# Patient Record
Sex: Male | Born: 1950 | Race: Black or African American | Hispanic: No | Marital: Married | Smoking: Current every day smoker
Health system: Southern US, Community
[De-identification: ages and names within clinical notes are randomized; demographics above are authoritative.]

## PROBLEM LIST (undated history)

## (undated) DIAGNOSIS — I10 Essential (primary) hypertension: Secondary | ICD-10-CM

## (undated) DIAGNOSIS — E119 Type 2 diabetes mellitus without complications: Secondary | ICD-10-CM

## (undated) DIAGNOSIS — C801 Malignant (primary) neoplasm, unspecified: Secondary | ICD-10-CM

## (undated) DIAGNOSIS — Z8719 Personal history of other diseases of the digestive system: Secondary | ICD-10-CM

## (undated) DIAGNOSIS — I482 Chronic atrial fibrillation, unspecified: Secondary | ICD-10-CM

## (undated) HISTORY — PX: COLONOSCOPY: SHX174

---

## 2010-02-04 HISTORY — PX: PROSTATECTOMY: SHX69

## 2013-06-29 ENCOUNTER — Emergency Department: Payer: Self-pay | Admitting: Emergency Medicine

## 2015-03-25 ENCOUNTER — Emergency Department
Admission: EM | Admit: 2015-03-25 | Discharge: 2015-03-25 | Disposition: A | Discharge status: Home / Self Care | Payer: Medicare HMO | Attending: Emergency Medicine | Admitting: Emergency Medicine

## 2015-03-25 ENCOUNTER — Encounter: Payer: Self-pay | Admitting: Emergency Medicine

## 2015-03-25 DIAGNOSIS — J219 Acute bronchiolitis, unspecified: Secondary | ICD-10-CM | POA: Diagnosis not present

## 2015-03-25 DIAGNOSIS — E119 Type 2 diabetes mellitus without complications: Secondary | ICD-10-CM | POA: Diagnosis not present

## 2015-03-25 DIAGNOSIS — R05 Cough: Secondary | ICD-10-CM | POA: Diagnosis present

## 2015-03-25 DIAGNOSIS — I10 Essential (primary) hypertension: Secondary | ICD-10-CM | POA: Diagnosis not present

## 2015-03-25 DIAGNOSIS — F17200 Nicotine dependence, unspecified, uncomplicated: Secondary | ICD-10-CM | POA: Insufficient documentation

## 2015-03-25 HISTORY — DX: Type 2 diabetes mellitus without complications: E11.9

## 2015-03-25 HISTORY — DX: Essential (primary) hypertension: I10

## 2015-03-25 LAB — RAPID INFLUENZA A&B ANTIGENS: Influenza B (ARMC): NOT DETECTED

## 2015-03-25 LAB — RAPID INFLUENZA A&B ANTIGENS (ARMC ONLY): INFLUENZA A (ARMC): NOT DETECTED

## 2015-03-25 MED ORDER — AZITHROMYCIN 250 MG PO TABS: ORAL_TABLET | ORAL | Status: DC

## 2015-03-25 MED ORDER — BENZONATATE 100 MG PO CAPS: 200.0000 mg | ORAL_CAPSULE | Freq: Three times a day (TID) | ORAL | Status: DC | PRN

## 2015-03-25 NOTE — ED Notes (Signed)
NAD noted at time of D/C. Pt denies questions or concerns. Pt ambulatory to the lobby at this time.  

## 2015-03-25 NOTE — Discharge Instructions (Signed)

## 2015-03-25 NOTE — ED Provider Notes (Signed)
[facility] Emergency Department Provider Note  ____________________________________________  Time seen: Approximately 2:40 PM  I have reviewed the triage vital signs and the nursing notes.   HISTORY  Chief Complaint Cough    HPI Jason Goodman is a 65 y.o. male , NAD, course in emergency department with 1 week history of body aches, cough, fever and fatigue. Notes cough can be productive of green sputum. Has not taken anything over-the-counter at this time for his symptoms and notes he cannot take cough syrup due to his diabetes. No known sick contacts. Denies chest pain, back pain, headache. No numbness, weakness, tingling. Has had no nausea, vomiting, abdominal pain, diarrhea.   Past Medical History  Diagnosis Date  . Hypertension   . Diabetes mellitus without complication ([place])     There are no active problems to display for this patient.   History reviewed. No pertinent past surgical history.  Current Outpatient Rx  Name  Route  Sig  Dispense  Refill  . azithromycin (ZITHROMAX Z-PAK) 250 MG tablet      Take 2 tablets (500 mg) on  Day 1,  followed by 1 tablet (250 mg) once daily on Days 2 through 5.   6 each   0   . benzonatate (TESSALON PERLES) 100 MG capsule   Oral   Take 2 capsules (200 mg total) by mouth 3 (three) times daily as needed for cough.   30 capsule   0     Allergies Review of patient's allergies indicates no known allergies.  History reviewed. No pertinent family history.  Social History Social History  Substance Use Topics  . Smoking status: Current Every Day Smoker  . Smokeless tobacco: None  . Alcohol Use: None     Review of Systems  Constitutional: Positive fever/chills, fatigue.  Eyes: No visual changes. No discharge ENT: No sore throat, sinus pressure, ear pain, nasal congestion. Cardiovascular: No chest pain. Respiratory: Positive productive cough. No shortness of breath. No wheezing.  Gastrointestinal:  No abdominal pain.  No nausea, vomiting.  No diarrhea, constipation. Musculoskeletal: Negative for back pain.  Skin: Negative for rash. Neurological: Negative for headaches, focal weakness or numbness. 10-point ROS otherwise negative.  ____________________________________________   PHYSICAL EXAM:  VITAL SIGNS: ED Triage Vitals  Enc Vitals Group     BP 03/25/15 1225 108/65 mmHg     Pulse Rate 03/25/15 1225 94     Resp 03/25/15 1225 20     Temp 03/25/15 1225 98.7 F (37.1 C)     Temp Source 03/25/15 1225 Oral     SpO2 03/25/15 1225 100 %     Weight 03/25/15 1225 180 lb (81.647 kg)     Height 03/25/15 1225 6' (1.829 m)     Head Cir --      Peak Flow --      Pain Score 03/25/15 1219 8     Pain Loc --      Pain Edu? --      Excl. in [place]? --     Constitutional: Alert and oriented. Well appearing and in no acute distress. Eyes: Conjunctivae are normal. PERRL. EOMI without pain.  Head: Atraumatic. ENT:      Ears: TMs visualized bilaterally without erythema, effusion, bulging, perforation. Bilateral external ear canals without erythema, swelling, discharge.      Nose: No congestion/rhinnorhea.      Mouth/Throat: Mucous membranes are moist. Pharynx without erythema, swelling, exudates. Mild clear postnasal drip. Neck: No stridor. No cervical spine  tenderness to palpation. Supple with full range of motion. Hematological/Lymphatic/Immunilogical: No cervical lymphadenopathy. Cardiovascular: Normal rate, regular rhythm. Normal S1 and S2.  Good peripheral circulation. Respiratory: Normal respiratory effort without tachypnea or retractions. Lungs CTAB. Neurologic:  Normal speech and language. No gross focal neurologic deficits are appreciated.  Skin:  Skin is warm, dry and intact. No rash noted. Psychiatric: Mood and affect are normal. Speech and behavior are normal. Patient exhibits appropriate insight and judgement.   ____________________________________________   LABS (all labs  ordered are listed, but only abnormal results are displayed)  Labs Reviewed  RAPID INFLUENZA A&B ANTIGENS ([facility] ONLY)   ____________________________________________  EKG  None ____________________________________________  RADIOLOGY  None ____________________________________________    PROCEDURES  Procedure(s) performed: None   Medications - No data to display   ____________________________________________   INITIAL IMPRESSION / ASSESSMENT AND PLAN / ED COURSE  Pertinent lab results that were available during my care of the patient were reviewed by me and considered in my medical decision making (see chart for details).  Patient's diagnosis is consistent with acute bronchitis. Patient will be discharged home with prescriptions for Z-Pak and Tessalon Perles. Patient is to follow up with her care provider if symptoms persist past this treatment course. Patient is given ED precautions to return to the ED for any worsening or new symptoms.    ____________________________________________  FINAL CLINICAL IMPRESSION(S) / ED DIAGNOSES  Final diagnoses:  Acute bronchiolitis due to unspecified organism      NEW MEDICATIONS STARTED DURING THIS VISIT:  Discharge Medication List as of 03/25/2015  2:43 PM    START taking these medications   Details  azithromycin (ZITHROMAX Z-PAK) 250 MG tablet Take 2 tablets (500 mg) on  Day 1,  followed by 1 tablet (250 mg) once daily on Days 2 through 5., Print    benzonatate (TESSALON PERLES) 100 MG capsule Take 2 capsules (200 mg total) by mouth 3 (three) times daily as needed for cough., Starting 03/25/2015, Until Discontinued, Print             Braxton Feathers, PA-C 03/25/15 1505  Earleen Newport, MD 03/25/15 [phone]

## 2015-03-25 NOTE — ED Notes (Signed)
NAD noted at this time. Pt c/o cough, fever, generalized body aches x 1 week. Pt states he has been coughing up green sputum. Breath sounds clear bilaterally. Pt ambulatory, skin warm, dry and intact, A&O x4 at time of assessment.

## 2015-03-25 NOTE — ED Notes (Signed)
Reports cough and congestion and aching all over. No resp distress

## 2015-05-27 ENCOUNTER — Inpatient Hospital Stay
Admission: EM | Admit: 2015-05-27 | Discharge: 2015-05-28 | DRG: 920 | Disposition: A | Discharge status: Home / Self Care | Payer: Medicare HMO | Attending: Internal Medicine | Admitting: Internal Medicine

## 2015-05-27 ENCOUNTER — Emergency Department: Payer: Medicare HMO

## 2015-05-27 ENCOUNTER — Encounter: Payer: Self-pay | Admitting: Emergency Medicine

## 2015-05-27 DIAGNOSIS — Z86010 Personal history of colonic polyps: Secondary | ICD-10-CM

## 2015-05-27 DIAGNOSIS — E119 Type 2 diabetes mellitus without complications: Secondary | ICD-10-CM | POA: Diagnosis present

## 2015-05-27 DIAGNOSIS — Z8546 Personal history of malignant neoplasm of prostate: Secondary | ICD-10-CM | POA: Diagnosis not present

## 2015-05-27 DIAGNOSIS — K922 Gastrointestinal hemorrhage, unspecified: Secondary | ICD-10-CM | POA: Diagnosis present

## 2015-05-27 DIAGNOSIS — I10 Essential (primary) hypertension: Secondary | ICD-10-CM | POA: Diagnosis present

## 2015-05-27 DIAGNOSIS — K91840 Postprocedural hemorrhage and hematoma of a digestive system organ or structure following a digestive system procedure: Principal | ICD-10-CM | POA: Diagnosis present

## 2015-05-27 DIAGNOSIS — E785 Hyperlipidemia, unspecified: Secondary | ICD-10-CM | POA: Diagnosis present

## 2015-05-27 DIAGNOSIS — Y838 Other surgical procedures as the cause of abnormal reaction of the patient, or of later complication, without mention of misadventure at the time of the procedure: Secondary | ICD-10-CM | POA: Diagnosis present

## 2015-05-27 DIAGNOSIS — Z79899 Other long term (current) drug therapy: Secondary | ICD-10-CM

## 2015-05-27 DIAGNOSIS — F17200 Nicotine dependence, unspecified, uncomplicated: Secondary | ICD-10-CM | POA: Diagnosis present

## 2015-05-27 DIAGNOSIS — I4891 Unspecified atrial fibrillation: Secondary | ICD-10-CM | POA: Diagnosis present

## 2015-05-27 DIAGNOSIS — Z7984 Long term (current) use of oral hypoglycemic drugs: Secondary | ICD-10-CM

## 2015-05-27 DIAGNOSIS — I959 Hypotension, unspecified: Secondary | ICD-10-CM | POA: Diagnosis present

## 2015-05-27 HISTORY — DX: Malignant (primary) neoplasm, unspecified: C80.1

## 2015-05-27 LAB — PROTIME-INR
INR: 1.14
Prothrombin Time: 14.8 seconds (ref 11.4–15.0)

## 2015-05-27 LAB — COMPREHENSIVE METABOLIC PANEL
ALBUMIN: 3.6 g/dL (ref 3.5–5.0)
ALT: 20 U/L (ref 17–63)
ANION GAP: 6 (ref 5–15)
AST: 21 U/L (ref 15–41)
Alkaline Phosphatase: 67 U/L (ref 38–126)
BILIRUBIN TOTAL: 0.8 mg/dL (ref 0.3–1.2)
BUN: 28 mg/dL — AB (ref 6–20)
CHLORIDE: 112 mmol/L — AB (ref 101–111)
CO2: 23 mmol/L (ref 22–32)
Calcium: 8.6 mg/dL — ABNORMAL LOW (ref 8.9–10.3)
Creatinine, Ser: 1.44 mg/dL — ABNORMAL HIGH (ref 0.61–1.24)
GFR calc Af Amer: 57 mL/min — ABNORMAL LOW (ref >60)
GFR, EST NON AFRICAN AMERICAN: 50 mL/min — AB (ref >60)
GLUCOSE: 122 mg/dL — AB (ref 65–99)
POTASSIUM: 4.2 mmol/L (ref 3.5–5.1)
Sodium: 141 mmol/L (ref 135–145)
TOTAL PROTEIN: 6 g/dL — AB (ref 6.5–8.1)

## 2015-05-27 LAB — CBC
HEMATOCRIT: 37.4 % — AB (ref 40.0–52.0)
HEMATOCRIT: 32.9 % — AB (ref 40.0–52.0)
HEMOGLOBIN: 12.3 g/dL — AB (ref 13.0–18.0)
Hemoglobin: 10.8 g/dL — ABNORMAL LOW (ref 13.0–18.0)
MCH: 31.4 pg (ref 26.0–34.0)
MCH: 30.8 pg (ref 26.0–34.0)
MCHC: 32.9 g/dL (ref 32.0–36.0)
MCHC: 32.8 g/dL (ref 32.0–36.0)
MCV: 95.6 fL (ref 80.0–100.0)
MCV: 94 fL (ref 80.0–100.0)
PLATELETS: 114 10*3/uL — AB (ref 150–440)
Platelets: 147 10*3/uL — ABNORMAL LOW (ref 150–440)
RBC: 3.91 MIL/uL — ABNORMAL LOW (ref 4.40–5.90)
RBC: 3.49 MIL/uL — AB (ref 4.40–5.90)
RDW: 14.7 % — ABNORMAL HIGH (ref 11.5–14.5)
RDW: 14.9 % — ABNORMAL HIGH (ref 11.5–14.5)
WBC: 6.5 10*3/uL (ref 3.8–10.6)
WBC: 6 10*3/uL (ref 3.8–10.6)

## 2015-05-27 LAB — GLUCOSE, CAPILLARY: Glucose-Capillary: 110 mg/dL — ABNORMAL HIGH (ref 65–99)

## 2015-05-27 LAB — TYPE AND SCREEN
ABO/RH(D): A POS
ANTIBODY SCREEN: NEGATIVE

## 2015-05-27 LAB — HEMOGLOBIN: Hemoglobin: 10.4 g/dL — ABNORMAL LOW (ref 13.0–18.0)

## 2015-05-27 LAB — ABO/RH: ABO/RH(D): A POS

## 2015-05-27 MED ORDER — TECHNETIUM TC 99M-LABELED RED BLOOD CELLS IV KIT
23.3500 | PACK | Freq: Once | INTRAVENOUS | Status: AC | PRN
  Administered 2015-05-27: 23.35 via INTRAVENOUS

## 2015-05-27 MED ORDER — SODIUM CHLORIDE 0.9 % IV BOLUS (SEPSIS)
1000.0000 mL | Freq: Once | INTRAVENOUS | Status: AC
Start: 2015-05-27 — End: 2015-05-27
  Administered 2015-05-27: 1000 mL via INTRAVENOUS

## 2015-05-27 MED ORDER — INSULIN ASPART 100 UNIT/ML ~~LOC~~ SOLN: 0.0000 [IU] | Freq: Three times a day (TID) | SUBCUTANEOUS | Status: DC

## 2015-05-27 MED ORDER — ONDANSETRON HCL 4 MG PO TABS: 4.0000 mg | ORAL_TABLET | Freq: Four times a day (QID) | ORAL | Status: DC | PRN

## 2015-05-27 MED ORDER — ACETAMINOPHEN 325 MG PO TABS: 650.0000 mg | ORAL_TABLET | Freq: Four times a day (QID) | ORAL | Status: DC | PRN

## 2015-05-27 MED ORDER — SODIUM CHLORIDE 0.9% FLUSH: 3.0000 mL | Freq: Two times a day (BID) | INTRAVENOUS | Status: DC

## 2015-05-27 MED ORDER — ACETAMINOPHEN 650 MG RE SUPP
650.0000 mg | Freq: Four times a day (QID) | RECTAL | Status: DC | PRN
Start: 2015-05-27 — End: 2015-05-28

## 2015-05-27 MED ORDER — ATORVASTATIN CALCIUM 20 MG PO TABS
40.0000 mg | ORAL_TABLET | ORAL | Status: DC
  Administered 2015-05-28: 40 mg via ORAL
  Filled 2015-05-27: qty 2

## 2015-05-27 MED ORDER — DIATRIZOATE MEGLUMINE & SODIUM 66-10 % PO SOLN
15.0000 mL | Freq: Once | ORAL | Status: AC
  Administered 2015-05-27: 15 mL via ORAL

## 2015-05-27 MED ORDER — CARVEDILOL 12.5 MG PO TABS
25.0000 mg | ORAL_TABLET | Freq: Two times a day (BID) | ORAL | Status: DC
  Administered 2015-05-28 (×2): 25 mg via ORAL
  Filled 2015-05-27 (×2): qty 2

## 2015-05-27 MED ORDER — IOPAMIDOL (ISOVUE-300) INJECTION 61%
80.0000 mL | Freq: Once | INTRAVENOUS | Status: AC | PRN
  Administered 2015-05-27: 80 mL via INTRAVENOUS

## 2015-05-27 MED ORDER — ONDANSETRON HCL 4 MG/2ML IJ SOLN: 4.0000 mg | Freq: Four times a day (QID) | INTRAMUSCULAR | Status: DC | PRN

## 2015-05-27 MED ORDER — SODIUM CHLORIDE 0.9 % IV SOLN
INTRAVENOUS | Status: DC
  Administered 2015-05-27: 20:00:00 via INTRAVENOUS

## 2015-05-27 NOTE — ED Notes (Signed)
Report given to Terrence Dupont, RN. Pt still in [facility] at this time.

## 2015-05-27 NOTE — ED Notes (Signed)
Pt up to the restroom with no difficulty. Placed back on monitor at this time.

## 2015-05-27 NOTE — ED Notes (Signed)
Pt returned from CT °

## 2015-05-27 NOTE — ED Provider Notes (Addendum)
[facility] Emergency Department Provider Note  Time seen: 11:33 AM  I have reviewed the triage vital signs and the nursing notes.   HISTORY  Chief Complaint GI Bleeding    HPI Jason Goodman is a 65 y.o. male with a past medical history of hypertension and diabetes who presents to the emergency department or GI bleeding. According to the patient he had been having intermittent bloody stools. He was seen by GI medicine 2 days ago for a colonoscopy. Patient had 3 polyps removed. He states beginning yesterday morning he was experiencing occasional blood in his stool which they said would be normal however today he is having very frequent bright red blood stools. Patient had a bowel movement in the emergency department which was a large amount of bright red blood. Patient states moderate right lower quadrant pain dull/aching. Denies nausea or vomiting.     Past Medical History  Diagnosis Date  . Hypertension   . Diabetes mellitus without complication ([place])     There are no active problems to display for this patient.   Past Surgical History  Procedure Laterality Date  . Colonoscopy      Current Outpatient Rx  Name  Route  Sig  Dispense  Refill  . azithromycin (ZITHROMAX Z-PAK) 250 MG tablet      Take 2 tablets (500 mg) on  Day 1,  followed by 1 tablet (250 mg) once daily on Days 2 through 5.   6 each   0   . benzonatate (TESSALON PERLES) 100 MG capsule   Oral   Take 2 capsules (200 mg total) by mouth 3 (three) times daily as needed for cough.   30 capsule   0     Allergies Review of patient's allergies indicates no known allergies.  No family history on file.  Social History Social History  Substance Use Topics  . Smoking status: Current Every Day Smoker  . Smokeless tobacco: None  . Alcohol Use: No    Review of Systems Constitutional: Negative for fever. Cardiovascular: Negative for chest pain. Respiratory: Negative for shortness  of breath. Gastrointestinal: Positive for right lower quadrant abdominal pain. Negative for nausea or vomiting. Positive for bright red blood loose stool. Genitourinary: Negative for dysuria. Musculoskeletal: Negative for back pain. Neurological: Negative for headache 10-point ROS otherwise negative.  ____________________________________________   PHYSICAL EXAM:  VITAL SIGNS: ED Triage Vitals  Enc Vitals Group     BP 05/27/15 1102 94/78 mmHg     Pulse Rate 05/27/15 1102 77     Resp 05/27/15 1102 18     Temp 05/27/15 1102 98 F (36.7 C)     Temp Source 05/27/15 1102 Oral     SpO2 05/27/15 1102 100 %     Weight 05/27/15 1102 180 lb (81.647 kg)     Height 05/27/15 1102 6' (1.829 m)     Head Cir --      Peak Flow --      Pain Score 05/27/15 1103 5     Pain Loc --      Pain Edu? --      Excl. in [place]? --     Constitutional: Alert and oriented. Well appearing and in no distress. Eyes: Normal exam ENT   Head: Normocephalic and atraumatic.   Mouth/Throat: Mucous membranes are moist. Cardiovascular: Normal rate, regular rhythm. No murmur Respiratory: Normal respiratory effort without tachypnea nor retractions. Breath sounds are clear Gastrointestinal: Soft and nontender. No distention.  Musculoskeletal: Nontender  with normal range of motion in all extremities.  Neurologic:  Normal speech and language. No gross focal neurologic deficits Skin:  Skin is warm, dry and intact.  Psychiatric: Mood and affect are normal. Speech and behavior are normal.   ____________________________________________    EKG  EKG reviewed and interpreted by myself shows atrial fibrillation at 67 bpm, narrow QRS, normal axis, no concerning ST changes noted.  ____________________________________________    RADIOLOGY  CT scan of the abdomen and pelvis does not show any source of bleeding. No rupture/perforation. Patient does have a cylindrical 2 cm foreign body in the sigmoid colon consistent  with polyp clip.  ____________________________________________   INITIAL IMPRESSION / ASSESSMENT AND PLAN / ED COURSE  Pertinent labs & imaging results that were available during my care of the patient were reviewed by me and considered in my medical decision making (see chart for details).  Patient presents the department GI bleeding 2 days status post colonoscopy. While in the emergency department the patient had a large bright red blood stool, appeared to contain mostly blood. Currently patient is somewhat hypotensive 94/78 with a normal pulse rate however his EKG appears to show atrial fibrillation. Patient states he has a history of "in irregular heartbeat" however he states this is many years ago and as far as he knows he does not stay in atrial fibrillation. Patient does not take any blood thinners.  Patient's hemoglobin has resulted at 12.3. However given the amount of bloody stool patient will require admission to the hospital to trend his hemoglobin. Also the patient is in atrial fibrillation which is likely new.   I discussed the patient with Dr. Drema Dallas of GI medicine. Patient will be admitted to the hospital for further treatment. I've ordered a tagged red blood cell scan in an attempt to visualize the source of bleed.  Patient has been seen by Dr. Drema Dallas in the emergency department. He states that the patient continues to bleed he plans to perform a colonoscopy in the morning. I discussed with the hospitalist for admission, they state they would like the bleeding scan performed first before they admit.  ----------------------------------------- 7:02 PM on 05/27/2015 -----------------------------------------  Currently awaiting bleeding scan results. Patient is currently at nuclear medicine. We will continue to monitor the patient in the emergency department until the bleeding scan results are known and hopefully the patient can then be admitted to the hospital.  Bleeding scan is  negative. We will admit the patient to the hospital.  ____________________________________________   FINAL CLINICAL IMPRESSION(S) / ED DIAGNOSES  Atrial fibrillation Lower GI bleed   Harvest Dark, MD 05/27/15 1407  Harvest Dark, MD 05/27/15 [place], MD 05/27/15 [id number]

## 2015-05-27 NOTE — ED Notes (Signed)
Pt taken to Nuclear Medicine.

## 2015-05-27 NOTE — Consult Note (Signed)
GI Consultation Note  Referring Provider: Harvest Dark, MD Date of Consult: 05/27/2015  HPI: Jason Goodman is a 65 y.o. male being seen in consultation at the request of Harvest Dark, MD for rectal bleeding.  Jason Goodman is a 65 yo man with PMH significant for recent colonoscopy with polypectomy 2 days ago. His wife reports that Dr. Rayann Heman removed 3 polyps ranging from 34m to 20 mm, with a clip placed at one site. The pt tolerated the initial procedure well, however on the evening of the procedure he noted some blood on the toilet paper with wiping. This increased in volume yesterday morning, and he proceeded to have at least 5 BM that he describes as large volume blood "spurts" mixed with blood clots. He denies any abdominal pain associated with most of these, though at times he experienced some cramping. He notes that he took some Advil last week prior to the colonoscopy, but none after his procedure. He denies use of other blood thinners. He felt lightheaded with nausea this morning, which prompted his presentation. He has had 3 BM of large amounts of blood and clots earlier today in the ED, but none in the past few hours per his report.   PMH: Past Medical History  Diagnosis Date  . Hypertension   . Diabetes mellitus without complication ([place]   . Cancer ([facility]     PSH: Past Surgical History  Procedure Laterality Date  . Colonoscopy      Family History: No family history on file.  Social History: Social History   Social History  . Marital Status: Married    Spouse Name: N/A  . Number of Children: N/A  . Years of Education: N/A   Occupational History  . Not on file.   Social History Main Topics  . Smoking status: Current Every Day Smoker  . Smokeless tobacco: Not on file  . Alcohol Use: No  . Drug Use: Not on file  . Sexual Activity: Not on file   Other Topics Concern  . Not on file   Social History Narrative    ROS: The balance of a 12 system review  is negative other than as described in the HPI.  SCHEDULED MEDS:   PHYSICAL EXAM: Filed Vitals:   05/27/15 1400 05/27/15 1528  BP: 123/87 117/85  Pulse: 72 66  Temp:    Resp: 22 18    GEN: Alert, oriented x3 in no apparent distress. HEENT: Oropharynx clear. Anicteric CV: Nl rate, nl rhythm. No murmurs, rubs or gallops. LUNGS: Clear to auscultation bilaterally. No wheezes, rales or rhonchi. ABD: Bowel sounds present. Abdomen soft, mildly tender to palpation in the RLQ, nondistended. EXT: no edema NEURO: no focal neurologic deficits.  LABS: CBC Latest Ref Rng 05/27/2015  WBC 3.8 - 10.6 K/uL 6.5  Hemoglobin 13.0 - 18.0 g/dL 12.3(L)  Hematocrit 40.0 - 52.0 % 37.4(L)  Platelets 150 - 440 K/uL 147(L)    BMP Latest Ref Rng 05/27/2015  Glucose 65 - 99 mg/dL 122(H)  BUN 6 - 20 mg/dL 28(H)  Creatinine 0.61 - 1.24 mg/dL 1.44(H)  Sodium 135 - 145 mmol/L 141  Potassium 3.5 - 5.1 mmol/L 4.2  Chloride 101 - 111 mmol/L 112(H)  CO2 22 - 32 mmol/L 23  Calcium 8.9 - 10.3 mg/dL 8.6(L)    Hepatic Function Latest Ref Rng 05/27/2015  Total Protein 6.5 - 8.1 g/dL 6.0(L)  Albumin 3.5 - 5.0 g/dL 3.6  AST 15 - 41 U/L 21  ALT 17 - 63  U/L 20  Alk Phosphatase 38 - 126 U/L 67  Total Bilirubin 0.3 - 1.2 mg/dL 0.8    ASSESSMENT: Jason Goodman is a 65 y.o. male with recent colonoscopy and polypectomy now here with bleeding. He describes blood and clots passing per rectum in the past 24 hours, which are concerning for a post-polypectomy bleed. He denies any use of recent NSAIDs or other blood thinners. The bleeding may have slowed in frequency, and understandably, he is not excited about the prospects of another colonoscopy prep. For now I have recommended monitoring for further bleeding episodes. If he continues to have bleeding in the next 4 hours, we should plan to prep with Miralax 238g in 64 oz Gatorade for colonoscopy tomorrow morning. If he has no further bleeding and his Hgb/Hct are stable, he  may not need repeat colonoscopy and intervention. He expressed understanding and is agreeable to this plan. I have discussed this plan with the Dr. Kerman Passey in the ED who is in agreement. I am not sure that a tagged RBC scan will add much in this case, given my high suspicion that this bleeding episode is due to a post-polypectomy bleed (given temporal relationship to recent colonoscopic evaluation, which presumably would have revealed other bleeding sources).   RECOMMENDATIONS: - clear liquid diet now - monitor for further bleeding - if continued bleeding in next few hours, plan for colonoscopy tomorrow - if colonoscopy tomorrow, prep with Miralax 238 g in 64 oz Gatorade (pt does not want GoLytely) - continue to monitor Hgb/Hct  Lameeka Schleifer L. Drema Dallas, MD, MPH

## 2015-05-27 NOTE — ED Notes (Signed)
Patient presents to the ED with black stools, bright red rectal bleeding, and passing blood clots through patient's rectum that began around 10pm yesterday.  Patient reports having a colonoscopy performed on Thursday and reports that several polyps were removed at that time.  Patient has history of "irregular heartbeat" and is in Afib at this time.  Patient denies taking blood thinners to the best of his knowledge.  Patient is alert and oriented x 4.  Reports weakness.  Patient's skin appears slightly pale.

## 2015-05-27 NOTE — H&P (Signed)
[facility] [place] NAME: Jason Goodman    MR#:  [id number]  DATE OF BIRTH:  1950-02-19  DATE OF ADMISSION:  05/27/2015  PRIMARY CARE PHYSICIAN: Ancil Boozer MD.   REQUESTING/REFERRING PHYSICIAN: Harvest Dark MD  CHIEF COMPLAINT:   Chief Complaint  Patient presents with  . GI Bleeding    HISTORY OF PRESENT ILLNESS: Jason Goodman  is a 65 y.o. male with a known history of  DMII, HTN, with prostate cancer Who underwent a colonoscopy with polypectomy 2 days ago with Dr. Rayann Heman. Patient had 3 polyps removed. One polyp was 17 mm to was 20 mm. One of the polyps needed a clip placement. Patient reports that he noticed some bright red blood on the evening of the procedure. However since yesterday he had increase in the amount of bleeding. He describes the bleeding as bright red blood mixed with clots and dark blood. He reports that it was "squirting". Patient had multiple episodes of this. He came to the ED. Due to persistent bleeding he underwent a bleeding scan. Which was negative. He was also seen in consultation by GI who recommended admission. Patient reports that the last bowel movement he had noticed that the bleeding had stopped. He also had some abdominal cramping when he was having these bleeding episodes. PAST MEDICAL HISTORY:   Past Medical History  Diagnosis Date  . Hypertension   . Diabetes mellitus without complication ([place])   . prostate cancer     PAST SURGICAL HISTORY: Past Surgical History  Procedure Laterality Date  . Colonoscopy      SOCIAL HISTORY:  Social History  Substance Use Topics  . Smoking status: Current Every Day Smoker  . Smokeless tobacco: Not on file  . Alcohol Use: No    FAMILY HISTORY:  Family History  Problem Relation Age of Onset  . Hypertension      DRUG ALLERGIES: No Known Allergies  REVIEW OF SYSTEMS:   CONSTITUTIONAL: No fever, fatigue or weakness.  EYES: No blurred or double vision.   EARS, NOSE, AND THROAT: No tinnitus or ear pain.  RESPIRATORY: No cough, shortness of breath, wheezing or hemoptysis.  CARDIOVASCULAR: No chest pain, orthopnea, edema.  GASTROINTESTINAL: No nausea, vomiting, diarrhea orPositive abdominal cramping, positive bright red blood per rectum  GENITOURINARY: No dysuria, hematuria.  ENDOCRINE: No polyuria, nocturia,  HEMATOLOGY: No anemia, easy bruising or bleeding SKIN: No rash or lesion. MUSCULOSKELETAL: No joint pain or arthritis.   NEUROLOGIC: No tingling, numbness, weakness.  PSYCHIATRY: No anxiety or depression.   MEDICATIONS AT HOME:  Prior to Admission medications   Medication Sig Start Date End Date Taking? Authorizing Provider  amLODipine (NORVASC) 5 MG tablet Take 5 mg by mouth daily. 08/04/13  Yes Historical Provider, MD  atorvastatin (LIPITOR) 40 MG tablet Take 40 mg by mouth every morning. 08/04/13  Yes Historical Provider, MD  carvedilol (COREG) 25 MG tablet Take 25 mg by mouth 2 (two) times daily. 08/04/13  Yes Historical Provider, MD  metFORMIN (GLUCOPHAGE) 500 MG tablet Take 500 mg by mouth daily. 03/17/14  Yes Historical Provider, MD  azithromycin (ZITHROMAX Z-PAK) 250 MG tablet Take 2 tablets (500 mg) on  Day 1,  followed by 1 tablet (250 mg) once daily on Days 2 through 5. 03/25/15   Jami L Hagler, PA-C  benzonatate (TESSALON PERLES) 100 MG capsule Take 2 capsules (200 mg total) by mouth 3 (three) times daily as needed for cough. 03/25/15   Jami L  Hagler, PA-C      PHYSICAL EXAMINATION:   VITAL SIGNS: Blood pressure 113/85, pulse 59, temperature 98 F (36.7 C), temperature source Oral, resp. rate 17, height 6' (1.829 m), weight 81.647 kg (180 lb), SpO2 100 %.  GENERAL:  65 y.o.-year-old patient lying in the bed with no acute distress.  EYES: Pupils equal, round, reactive to light and accommodation. No scleral icterus. Extraocular muscles intact.  HEENT: Head atraumatic, normocephalic. Oropharynx and nasopharynx clear.  NECK:   Supple, no jugular venous distention. No thyroid enlargement, no tenderness.  LUNGS: Normal breath sounds bilaterally, no wheezing, rales,rhonchi or crepitation. No use of accessory muscles of respiration.  CARDIOVASCULAR: S1, S2 normal. No murmurs, rubs, or gallops.  ABDOMEN: Soft, nontender, nondistended. Bowel sounds present. No organomegaly or mass.  EXTREMITIES: No pedal edema, cyanosis, or clubbing.  NEUROLOGIC: Cranial nerves II through XII are intact. Muscle strength 5/5 in all extremities. Sensation intact. Gait not checked.  PSYCHIATRIC: The patient is alert and oriented x 3.  SKIN: No obvious rash, lesion, or ulcer.   LABORATORY PANEL:   CBC  Recent Labs Lab 05/27/15 1104 05/27/15 1558  WBC 6.5 6.0  HGB 12.3* 10.8*  HCT 37.4* 32.9*  PLT 147* 114*  MCV 95.6 94.0  MCH 31.4 30.8  MCHC 32.9 32.8  RDW 14.7* 14.9*   ------------------------------------------------------------------------------------------------------------------  Chemistries   Recent Labs Lab 05/27/15 1104  NA 141  K 4.2  CL 112*  CO2 23  GLUCOSE 122*  BUN 28*  CREATININE 1.44*  CALCIUM 8.6*  AST 21  ALT 20  ALKPHOS 67  BILITOT 0.8   ------------------------------------------------------------------------------------------------------------------ estimated creatinine clearance is 56.1 mL/min (by C-G formula based on Cr of 1.44). ------------------------------------------------------------------------------------------------------------------ No results for input(s): TSH, T4TOTAL, T3FREE, THYROIDAB in the last 72 hours.  Invalid input(s): FREET3   Coagulation profile  Recent Labs Lab 05/27/15 1104  INR 1.14   ------------------------------------------------------------------------------------------------------------------- No results for input(s): DDIMER in the last 72  hours. -------------------------------------------------------------------------------------------------------------------  Cardiac Enzymes No results for input(s): CKMB, TROPONINI, MYOGLOBIN in the last 168 hours.  Invalid input(s): CK ------------------------------------------------------------------------------------------------------------------ Invalid input(s): POCBNP  ---------------------------------------------------------------------------------------------------------------  Urinalysis No results found for: COLORURINE, APPEARANCEUR, LABSPEC, PHURINE, GLUCOSEU, HGBUR, BILIRUBINUR, KETONESUR, PROTEINUR, UROBILINOGEN, NITRITE, LEUKOCYTESUR   RADIOLOGY: Nm Gi Blood Loss  05/27/2015  CLINICAL DATA:  A suggest some bloating 2 days status post colonoscopy. Activity bleeding ovary time mucosa bathroom. EXAM: NUCLEAR MEDICINE GASTROINTESTINAL BLEEDING SCAN TECHNIQUE: Sequential abdominal images were obtained following intravenous administration of Tc-18m labeled red blood cells. RADIOPHARMACEUTICALS:  23.4 mCi Tc-80m in-vitro labeled red cells. COMPARISON:  CT 05/27/2015 FINDINGS: There is no accumulation of tagged red blood cells within the abdomen or pelvis to localize active gastrointestinal bleeding. There is activity inferior to the bladder which is felt to represent urine contamination. This is relatively static throughout the exam. Physiologic activity is noted in the blood pool and bladder. IMPRESSION: No scintigraphic evidence of active gastrointestinal bleeding. Electronically Signed   By: Suzy Bouchard M.D.   On: 05/27/2015 19:33   Ct Abdomen Pelvis W Contrast  05/27/2015  CLINICAL DATA:  Patient presents to the ED with black stools, bright red rectal bleeding, and passing blood clots through patient's rectum that began around 10pm yesterday. Patient reports having a colonoscopy performed on Thursday and reports that several polyps were removed at that time. Patient has history  of "irregular heartbeat" and is in Afib at this time. Patient denies taking blood thinners to the best of his knowledge. Patient is alert and  oriented x 4. Reports weakness. Patient's skin appears slightly pale. HX prostate ca removed. EXAM: CT ABDOMEN AND PELVIS WITH CONTRAST TECHNIQUE: Multidetector CT imaging of the abdomen and pelvis was performed using the standard protocol following bolus administration of intravenous contrast. CONTRAST:  64mL ISOVUE-300 IOPAMIDOL (ISOVUE-300) INJECTION 61% COMPARISON:  None. FINDINGS: Lung bases:  Clear.  Heart normal size. Liver, spleen, gallbladder, pancreas:  Unremarkable. Adrenal glands: Large left adrenal cystic mass with Hounsfield units averaging 11.5. There is peripheral calcification along this mass. It measures 6.5 x 5.6 cm in greatest transverse dimension. This is likely the sequela of a remote adrenal hemorrhage. Normal right adrenal gland. Kidneys, ureters, bladder: Bilateral renal cortical thinning. There are several low-density renal masses, most too small to fully characterize. The largest arises from the midpole of the left kidney measuring 5.3 cm, consistent with a cyst. No ureteral or renal stones. No hydronephrosis. Ureters normal course and caliber. Directly posterior to the bladder and there is a small fluid collection measuring 2.4 x 1.9 x 2.0 cm. Although this is not clearly arising from the bladder, a small posterior bladder diverticulum is suspected. It could alternatively reflect a seminal vesicle cyst. Bladder otherwise unremarkable. Reproductive: Patient has a penile prosthesis with the reservoir in the left antral lateral pelvis. Lymph nodes:  No adenopathy. Vascular: Infrarenal abdominal aortic aneurysm measuring maximum of 4.3 x 3.8 cm transversely. Ascites:  None. Gastrointestinal: There is a cylindrical metal foreign body that lies within the sigmoid colon. Measures 2 cm in length. This is of unclear etiology. There is no evidence of bowel  obstruction. There is no colonic wall thickening or inflammatory change. There is no extraluminal air to suggest a colonic perforation following colonoscopy. Small hiatal hernia. Stomach otherwise unremarkable. Normal small bowel. Appendix not visualized. Musculoskeletal: Mild disc degenerative changes along the lumbar spine. No osteoblastic or osteolytic lesions. IMPRESSION: 1. Source of the patient's GI bleeding is not conclusive on this exam. 2. There is no evidence of a bowel perforation. No colonic mass is seen. 3. There is a 2 cm cylindrical metallic foreign body in the sigmoid colon of unclear etiology. 4. Multiple chronic findings, including: 6.5 cm low-density, peripherally calcified left adrenal mass likely a sequelae of a remote left adrenal hemorrhage, renal cortical thinning. 5.3 cm left renal cyst. It 5. Infrarenal abdominal aortic aneurysm measuring a maximum of 4.3 x 3.8 cm. Recommend followup by ultrasound in 1 year. This recommendation follows ACR consensus guidelines: White Paper of the ACR Incidental Findings Committee II on Vascular Findings. J Am Coll Radiol 2013; 10:789-794. Electronically Signed   By: Lajean Manes M.D.   On: 05/27/2015 13:11    EKG: Orders placed or performed during the hospital encounter of 05/27/15  . ED EKG  . ED EKG    IMPRESSION AND PLAN: Patient is a 65 year old African-American male presenting with lower GI bleed  1. Lower GI bleed suspect due to recent polypectomy. At this point will monitor his hemoglobin and transfuse if hemodynamically unstable or hemoglobin drops below 7. He has been seen by GI who recommend clear liquid diet. If bleeding persists will need a colonoscopy according to them.  2. Diabetes type 2 that this point I'll hold his metformin just place him on sliding scale insulin  3. Hypertension borderline at this point I will continue his Coreg but hold his Norvasc  4. hyperlipedemia: continue atrovastatin   All the records are  reviewed and case discussed with ED provider. Management plans discussed with the  patient, family and they are in agreement.  CODE STATUS:    Code Status Orders        Start     Ordered   05/27/15 2005  Full code   Continuous     05/27/15 2004    Code Status History    Date Active Date Inactive Code Status Order ID Comments User Context   This patient has a current code status but no historical code status.       TOTAL TIME TAKING CARE OF THIS PATIENT:55 minutes.    Dustin Flock M.D on 05/27/2015 at 8:07 PM  Between 7am to 6pm - Pager - [phone]  After 6pm go to [email or web address] - password [email or web address] [facility]  [place] [facility]  Office  [phone]  CC: Primary care physician; No primary care provider on file.

## 2015-05-27 NOTE — ED Notes (Signed)
Pt taken to CT.

## 2015-05-28 LAB — BASIC METABOLIC PANEL
Anion gap: 5 (ref 5–15)
BUN: 21 mg/dL — AB (ref 6–20)
CALCIUM: 8.3 mg/dL — AB (ref 8.9–10.3)
CO2: 23 mmol/L (ref 22–32)
CREATININE: 1.16 mg/dL (ref 0.61–1.24)
Chloride: 113 mmol/L — ABNORMAL HIGH (ref 101–111)
GFR calc Af Amer: >60 mL/min (ref >60)
GFR calc non Af Amer: >60 mL/min (ref >60)
GLUCOSE: 75 mg/dL (ref 65–99)
Potassium: 4 mmol/L (ref 3.5–5.1)
Sodium: 141 mmol/L (ref 135–145)

## 2015-05-28 LAB — GLUCOSE, CAPILLARY
Glucose-Capillary: 82 mg/dL (ref 65–99)
Glucose-Capillary: 210 mg/dL — ABNORMAL HIGH (ref 65–99)

## 2015-05-28 LAB — CBC
HEMATOCRIT: 29.7 % — AB (ref 40.0–52.0)
Hemoglobin: 10 g/dL — ABNORMAL LOW (ref 13.0–18.0)
MCH: 32.1 pg (ref 26.0–34.0)
MCHC: 33.6 g/dL (ref 32.0–36.0)
MCV: 95.5 fL (ref 80.0–100.0)
Platelets: 110 10*3/uL — ABNORMAL LOW (ref 150–440)
RBC: 3.11 MIL/uL — ABNORMAL LOW (ref 4.40–5.90)
RDW: 14.5 % (ref 11.5–14.5)
WBC: 5.5 10*3/uL (ref 3.8–10.6)

## 2015-05-28 LAB — HEMOGLOBIN: Hemoglobin: 9.6 g/dL — ABNORMAL LOW (ref 13.0–18.0)

## 2015-05-28 NOTE — Discharge Summary (Signed)
[facility] [place] NAME: Bow Ader    MR#:  [id number]  DATE OF BIRTH:  29-Jun-1950  DATE OF ADMISSION:  05/27/2015 ADMITTING PHYSICIAN: Dustin Flock, MD  DATE OF DISCHARGE: 05/28/15  PRIMARY CARE PHYSICIAN: No primary care provider on file.    ADMISSION DIAGNOSIS:  GI bleed [K92.2] Gastrointestinal hemorrhage, unspecified gastritis, unspecified gastrointestinal hemorrhage type [K92.2] Atrial fibrillation, unspecified type ([place]) [I48.91]  DISCHARGE DIAGNOSIS:  Active Problems:   GI bleed   SECONDARY DIAGNOSIS:   Past Medical History  Diagnosis Date  . Hypertension   . Diabetes mellitus without complication ([place])   . prostate cancer     HOSPITAL COURSE:   65y/oM with PMH of HTN, NIDDM who had colonoscopy and polypectomy done 3 days ago admitted for rectal bleed  #1 Post polypectomy rectal bleed- resolved now - still passing dark clots- much improved - Bleeding scan is negative, tolerating diet - No drop in Hb since admission - advised monitoring Hb more- but patient very eager to go home as his bleeding subsided - advised to come back to ER for any further bleeding - Appreciate GI consult - will be discharged today  #2 HTN- cont outpatient meds- norvasc and coreg  #3 NIDDM- cont metformin  #4 Hyperlipidemia- on statin  Discharge home today   DISCHARGE CONDITIONS:   Stable  CONSULTS OBTAINED:  Treatment Team:  Fredonia Highland, MD  DRUG ALLERGIES:  No Known Allergies  DISCHARGE MEDICATIONS:   Current Discharge Medication List    CONTINUE these medications which have NOT CHANGED   Details  amLODipine (NORVASC) 5 MG tablet Take 5 mg by mouth daily.    atorvastatin (LIPITOR) 40 MG tablet Take 40 mg by mouth every morning.    carvedilol (COREG) 25 MG tablet Take 25 mg by mouth 2 (two) times daily.    metFORMIN (GLUCOPHAGE) 500 MG tablet Take 500 mg by mouth daily.      STOP taking these  medications     azithromycin (ZITHROMAX Z-PAK) 250 MG tablet      benzonatate (TESSALON PERLES) 100 MG capsule          DISCHARGE INSTRUCTIONS:   1. Monitor for any rectal bleeding 2. PCP f/u in 1-2 weeks 3. GI f/u in 1-2 weeks  If you experience worsening of your admission symptoms, develop shortness of breath, life threatening emergency, suicidal or homicidal thoughts you must seek medical attention immediately by calling 911 or calling your MD immediately  if symptoms less severe.  You Must read complete instructions/literature along with all the possible adverse reactions/side effects for all the Medicines you take and that have been prescribed to you. Take any new Medicines after you have completely understood and accept all the possible adverse reactions/side effects.   Please note  You were cared for by a hospitalist during your hospital stay. If you have any questions about your discharge medications or the care you received while you were in the hospital after you are discharged, you can call the unit and asked to speak with the hospitalist on call if the hospitalist that took care of you is not available. Once you are discharged, your primary care physician will handle any further medical issues. Please note that NO REFILLS for any discharge medications will be authorized once you are discharged, as it is imperative that you return to your primary care physician (or establish a relationship with a primary care physician if you do not  have one) for your aftercare needs so that they can reassess your need for medications and monitor your lab values.    Today   CHIEF COMPLAINT:   Chief Complaint  Patient presents with  . GI Bleeding    VITAL SIGNS:  Blood pressure 112/73, pulse 67, temperature 98 F (36.7 C), temperature source Oral, resp. rate 20, height 6' (1.829 m), weight 80.06 kg (176 lb 8 oz), SpO2 99 %.  I/O:   Intake/Output Summary (Last 24 hours) at 05/28/15  1146 Last data filed at 05/28/15 0953  Gross per 24 hour  Intake    386 ml  Output      0 ml  Net    386 ml    PHYSICAL EXAMINATION:   Physical Exam  GENERAL:  65 y.o.-year-old patient lying in the bed with no acute distress.  EYES: Pupils equal, round, reactive to light and accommodation. No scleral icterus. Extraocular muscles intact.  HEENT: Head atraumatic, normocephalic. Oropharynx and nasopharynx clear.  NECK:  Supple, no jugular venous distention. No thyroid enlargement, no tenderness.  LUNGS: Normal breath sounds bilaterally, no wheezing, rales,rhonchi or crepitation. No use of accessory muscles of respiration.  CARDIOVASCULAR: S1, S2 normal. No murmurs, rubs, or gallops.  ABDOMEN: Soft, non-tender, non-distended. Bowel sounds present. No organomegaly or mass.  EXTREMITIES: No pedal edema, cyanosis, or clubbing.  NEUROLOGIC: Cranial nerves II through XII are intact. Muscle strength 5/5 in all extremities. Sensation intact. Gait not checked.  PSYCHIATRIC: The patient is alert and oriented x 3.  SKIN: No obvious rash, lesion, or ulcer.   DATA REVIEW:   CBC  Recent Labs Lab 05/28/15 0428  WBC 5.5  HGB 10.0*  HCT 29.7*  PLT 110*    Chemistries   Recent Labs Lab 05/27/15 1104 05/28/15 0428  NA 141 141  K 4.2 4.0  CL 112* 113*  CO2 23 23  GLUCOSE 122* 75  BUN 28* 21*  CREATININE 1.44* 1.16  CALCIUM 8.6* 8.3*  AST 21  --   ALT 20  --   ALKPHOS 67  --   BILITOT 0.8  --     Cardiac Enzymes No results for input(s): TROPONINI in the last 168 hours.  Microbiology Results  Results for orders placed or performed during the hospital encounter of 03/25/15  Rapid Influenza A&B Antigens [facility] only)     Status: None   Collection Time: 03/25/15  1:34 PM  Result Value Ref Range Status   Influenza A [facility]) NOT DETECTED  Final   Influenza B [facility]) NOT DETECTED  Final    RADIOLOGY:  Nm Gi Blood Loss  05/27/2015  CLINICAL DATA:  A suggest some bloating 2 days  status post colonoscopy. Activity bleeding ovary time mucosa bathroom. EXAM: NUCLEAR MEDICINE GASTROINTESTINAL BLEEDING SCAN TECHNIQUE: Sequential abdominal images were obtained following intravenous administration of Tc-48m labeled red blood cells. RADIOPHARMACEUTICALS:  23.4 mCi Tc-12m in-vitro labeled red cells. COMPARISON:  CT 05/27/2015 FINDINGS: There is no accumulation of tagged red blood cells within the abdomen or pelvis to localize active gastrointestinal bleeding. There is activity inferior to the bladder which is felt to represent urine contamination. This is relatively static throughout the exam. Physiologic activity is noted in the blood pool and bladder. IMPRESSION: No scintigraphic evidence of active gastrointestinal bleeding. Electronically Signed   By: Suzy Bouchard M.D.   On: 05/27/2015 19:33   Ct Abdomen Pelvis W Contrast  05/27/2015  CLINICAL DATA:  Patient presents to the ED with black stools, bright  red rectal bleeding, and passing blood clots through patient's rectum that began around 10pm yesterday. Patient reports having a colonoscopy performed on Thursday and reports that several polyps were removed at that time. Patient has history of "irregular heartbeat" and is in Afib at this time. Patient denies taking blood thinners to the best of his knowledge. Patient is alert and oriented x 4. Reports weakness. Patient's skin appears slightly pale. HX prostate ca removed. EXAM: CT ABDOMEN AND PELVIS WITH CONTRAST TECHNIQUE: Multidetector CT imaging of the abdomen and pelvis was performed using the standard protocol following bolus administration of intravenous contrast. CONTRAST:  77mL ISOVUE-300 IOPAMIDOL (ISOVUE-300) INJECTION 61% COMPARISON:  None. FINDINGS: Lung bases:  Clear.  Heart normal size. Liver, spleen, gallbladder, pancreas:  Unremarkable. Adrenal glands: Large left adrenal cystic mass with Hounsfield units averaging 11.5. There is peripheral calcification along this mass. It  measures 6.5 x 5.6 cm in greatest transverse dimension. This is likely the sequela of a remote adrenal hemorrhage. Normal right adrenal gland. Kidneys, ureters, bladder: Bilateral renal cortical thinning. There are several low-density renal masses, most too small to fully characterize. The largest arises from the midpole of the left kidney measuring 5.3 cm, consistent with a cyst. No ureteral or renal stones. No hydronephrosis. Ureters normal course and caliber. Directly posterior to the bladder and there is a small fluid collection measuring 2.4 x 1.9 x 2.0 cm. Although this is not clearly arising from the bladder, a small posterior bladder diverticulum is suspected. It could alternatively reflect a seminal vesicle cyst. Bladder otherwise unremarkable. Reproductive: Patient has a penile prosthesis with the reservoir in the left antral lateral pelvis. Lymph nodes:  No adenopathy. Vascular: Infrarenal abdominal aortic aneurysm measuring maximum of 4.3 x 3.8 cm transversely. Ascites:  None. Gastrointestinal: There is a cylindrical metal foreign body that lies within the sigmoid colon. Measures 2 cm in length. This is of unclear etiology. There is no evidence of bowel obstruction. There is no colonic wall thickening or inflammatory change. There is no extraluminal air to suggest a colonic perforation following colonoscopy. Small hiatal hernia. Stomach otherwise unremarkable. Normal small bowel. Appendix not visualized. Musculoskeletal: Mild disc degenerative changes along the lumbar spine. No osteoblastic or osteolytic lesions. IMPRESSION: 1. Source of the patient's GI bleeding is not conclusive on this exam. 2. There is no evidence of a bowel perforation. No colonic mass is seen. 3. There is a 2 cm cylindrical metallic foreign body in the sigmoid colon of unclear etiology. 4. Multiple chronic findings, including: 6.5 cm low-density, peripherally calcified left adrenal mass likely a sequelae of a remote left adrenal  hemorrhage, renal cortical thinning. 5.3 cm left renal cyst. It 5. Infrarenal abdominal aortic aneurysm measuring a maximum of 4.3 x 3.8 cm. Recommend followup by ultrasound in 1 year. This recommendation follows ACR consensus guidelines: White Paper of the ACR Incidental Findings Committee II on Vascular Findings. J Am Coll Radiol 2013; 10:789-794. Electronically Signed   By: Lajean Manes M.D.   On: 05/27/2015 13:11    EKG:   Orders placed or performed during the hospital encounter of 05/27/15  . ED EKG  . ED EKG      Management plans discussed with the patient, family and they are in agreement.  CODE STATUS:     Code Status Orders        Start     Ordered   05/27/15 2005  Full code   Continuous     05/27/15 2004    Code  Status History    Date Active Date Inactive Code Status Order ID Comments User Context   This patient has a current code status but no historical code status.      TOTAL TIME TAKING CARE OF THIS PATIENT: 37 minutes.    Gladstone Lighter M.D on 05/28/2015 at 11:46 AM  Between 7am to 6pm - Pager - [phone]  After 6pm go to [email or web address] - password [email or web address] [facility]  [place] [facility]  Office  [phone]  CC: Primary care physician; No primary care provider on file.

## 2015-05-28 NOTE — Progress Notes (Signed)
Pt to be discharged per MD order. IV removed. Instructions reviewed with pt and all questions answered. No scripts.

## 2015-05-28 NOTE — Consult Note (Signed)
GI Consultation Note  Referring Provider: Gladstone Lighter, MD Date of Consult: 05/28/2015  HPI: Jason Goodman is a 65 y.o. male being seen in consultation at the request of Gladstone Lighter, MD for rectal bleeding.  The pt states that overnight the bleeding stopped. He passed a few small clots, but he stopped having the urge to defecate that he was having while he was having bleeding. He feels well this am, without weakness, dizziness, or lightheadedness which he had previously experienced.  SCHEDULED MEDS: None listed  PHYSICAL EXAM: Filed Vitals:   05/28/15 0008 05/28/15 0439  BP: 124/93 112/73  Pulse: 67 67  Temp:  98 F (36.7 C)  Resp:  20    GEN: Alert, oriented x3 in no apparent distress. HEENT: Oropharynx clear. Anicteric CV: Nl rate, nl rhythm. No murmurs, rubs or gallops. LUNGS: Clear to auscultation bilaterally. No wheezes, rales or rhonchi. ABD: Bowel sounds present. Abdomen soft, mildly tender to palpation in the RLQ, nondistended. EXT: no edema NEURO: no focal neurologic deficits.  LABS: CBC Latest Ref Rng 05/28/2015 05/27/2015 05/27/2015  WBC 3.8 - 10.6 K/uL 5.5 - 6.0  Hemoglobin 13.0 - 18.0 g/dL 10.0(L) 10.4(L) 10.8(L)  Hematocrit 40.0 - 52.0 % 29.7(L) - 32.9(L)  Platelets 150 - 440 K/uL 110(L) - 114(L)    BMP Latest Ref Rng 05/28/2015 05/27/2015  Glucose 65 - 99 mg/dL 75 122(H)  BUN 6 - 20 mg/dL 21(H) 28(H)  Creatinine 0.61 - 1.24 mg/dL 1.16 1.44(H)  Sodium 135 - 145 mmol/L 141 141  Potassium 3.5 - 5.1 mmol/L 4.0 4.2  Chloride 101 - 111 mmol/L 113(H) 112(H)  CO2 22 - 32 mmol/L 23 23  Calcium 8.9 - 10.3 mg/dL 8.3(L) 8.6(L)    Hepatic Function Latest Ref Rng 05/27/2015  Total Protein 6.5 - 8.1 g/dL 6.0(L)  Albumin 3.5 - 5.0 g/dL 3.6  AST 15 - 41 U/L 21  ALT 17 - 63 U/L 20  Alk Phosphatase 38 - 126 U/L 67  Total Bilirubin 0.3 - 1.2 mg/dL 0.8    ASSESSMENT: Jason Goodman is a 65 y.o. male with recent colonoscopy and polypectomy now here with  bleeding. He describes blood and clots passing per rectum in the past 24 hours, which were concerning for a post-polypectomy bleed. This now appears to have resolved.  I have recommended advancing his diet and he should monitor for any further bleeding.   RECOMMENDATIONS: - ok to advance diet - monitor for further bleeding   Maanasa Aderhold L. Drema Dallas, MD, MPH

## 2015-06-05 ENCOUNTER — Emergency Department
Admission: EM | Admit: 2015-06-05 | Discharge: 2015-06-05 | Disposition: A | Discharge status: Home / Self Care | Payer: Medicare HMO | Attending: Emergency Medicine | Admitting: Emergency Medicine

## 2015-06-05 ENCOUNTER — Encounter: Payer: Self-pay | Admitting: Emergency Medicine

## 2015-06-05 ENCOUNTER — Emergency Department: Payer: Medicare HMO

## 2015-06-05 DIAGNOSIS — E119 Type 2 diabetes mellitus without complications: Secondary | ICD-10-CM | POA: Insufficient documentation

## 2015-06-05 DIAGNOSIS — Z8546 Personal history of malignant neoplasm of prostate: Secondary | ICD-10-CM | POA: Diagnosis not present

## 2015-06-05 DIAGNOSIS — I10 Essential (primary) hypertension: Secondary | ICD-10-CM | POA: Insufficient documentation

## 2015-06-05 DIAGNOSIS — Z7984 Long term (current) use of oral hypoglycemic drugs: Secondary | ICD-10-CM | POA: Diagnosis not present

## 2015-06-05 DIAGNOSIS — F17200 Nicotine dependence, unspecified, uncomplicated: Secondary | ICD-10-CM | POA: Diagnosis not present

## 2015-06-05 DIAGNOSIS — I482 Chronic atrial fibrillation: Secondary | ICD-10-CM | POA: Diagnosis not present

## 2015-06-05 DIAGNOSIS — Z79899 Other long term (current) drug therapy: Secondary | ICD-10-CM | POA: Insufficient documentation

## 2015-06-05 DIAGNOSIS — D62 Acute posthemorrhagic anemia: Secondary | ICD-10-CM | POA: Diagnosis not present

## 2015-06-05 DIAGNOSIS — R0602 Shortness of breath: Secondary | ICD-10-CM | POA: Diagnosis present

## 2015-06-05 HISTORY — DX: Personal history of other diseases of the digestive system: Z87.19

## 2015-06-05 HISTORY — DX: Chronic atrial fibrillation, unspecified: I48.20

## 2015-06-05 LAB — CBC
HEMATOCRIT: 26 % — AB (ref 40.0–52.0)
Hemoglobin: 8.5 g/dL — ABNORMAL LOW (ref 13.0–18.0)
MCH: 31.3 pg (ref 26.0–34.0)
MCHC: 32.7 g/dL (ref 32.0–36.0)
MCV: 95.7 fL (ref 80.0–100.0)
PLATELETS: 191 10*3/uL (ref 150–440)
RBC: 2.71 MIL/uL — ABNORMAL LOW (ref 4.40–5.90)
RDW: 15.5 % — AB (ref 11.5–14.5)
WBC: 5.4 10*3/uL (ref 3.8–10.6)

## 2015-06-05 LAB — COMPREHENSIVE METABOLIC PANEL
ALBUMIN: 3.7 g/dL (ref 3.5–5.0)
ALT: 26 U/L (ref 17–63)
AST: 25 U/L (ref 15–41)
Alkaline Phosphatase: 68 U/L (ref 38–126)
Anion gap: 6 (ref 5–15)
BUN: 23 mg/dL — AB (ref 6–20)
CHLORIDE: 110 mmol/L (ref 101–111)
CO2: 23 mmol/L (ref 22–32)
CREATININE: 1.5 mg/dL — AB (ref 0.61–1.24)
Calcium: 8.8 mg/dL — ABNORMAL LOW (ref 8.9–10.3)
GFR calc Af Amer: 55 mL/min — ABNORMAL LOW (ref >60)
GFR, EST NON AFRICAN AMERICAN: 47 mL/min — AB (ref >60)
GLUCOSE: 133 mg/dL — AB (ref 65–99)
POTASSIUM: 4 mmol/L (ref 3.5–5.1)
Sodium: 139 mmol/L (ref 135–145)
Total Bilirubin: 0.4 mg/dL (ref 0.3–1.2)
Total Protein: 6.1 g/dL — ABNORMAL LOW (ref 6.5–8.1)

## 2015-06-05 LAB — PREPARE RBC (CROSSMATCH)

## 2015-06-05 MED ORDER — SODIUM CHLORIDE 0.9 % IV SOLN
10.0000 mL/h | Freq: Once | INTRAVENOUS | Status: AC
  Administered 2015-06-05: 10 mL/h via INTRAVENOUS

## 2015-06-05 NOTE — ED Notes (Signed)
Pt reports dizziness, weakness and shortness of breath x3 days; reports hx of colonoscopy and had 3 polyps removed; reports rectal bleeding last about 1 week but denies any further rectal bleeding since 4/27. Pt reports he went to [facility] and had hemoglobin checked and it was 8.3.

## 2015-06-05 NOTE — Discharge Instructions (Signed)
As we discussed, we gave you one unit of blood in the emergency department to treat the blood loss from your polyp removal.  It is important that you follow-up with Dr. Grayland Ormond the hematologist for additional blood loss/anemia workup, as well as with your primary care doctor.  Return to the emergency department if you develop new or worsening symptoms that concern you.   Anemia, Nonspecific Anemia is a condition in which the concentration of red blood cells or hemoglobin in the blood is below normal. Hemoglobin is a substance in red blood cells that carries oxygen to the tissues of the body. Anemia results in not enough oxygen reaching these tissues.  CAUSES  Common causes of anemia include:   Excessive bleeding. Bleeding may be internal or external. This includes excessive bleeding from periods (in women) or from the intestine.   Poor nutrition.   Chronic kidney, thyroid, and liver disease.  Bone marrow disorders that decrease red blood cell production.  Cancer and treatments for cancer.  HIV, AIDS, and their treatments.  Spleen problems that increase red blood cell destruction.  Blood disorders.  Excess destruction of red blood cells due to infection, medicines, and autoimmune disorders. SIGNS AND SYMPTOMS   Minor weakness.   Dizziness.   Headache.  Palpitations.   Shortness of breath, especially with exercise.   Paleness.  Cold sensitivity.  Indigestion.  Nausea.  Difficulty sleeping.  Difficulty concentrating. Symptoms may occur suddenly or they may develop slowly.  DIAGNOSIS  Additional blood tests are often needed. These help your health care provider determine the best treatment. Your health care provider will check your stool for blood and look for other causes of blood loss.  TREATMENT  Treatment varies depending on the cause of the anemia. Treatment can include:   Supplements of iron, vitamin 123456, or folic acid.   Hormone medicines.   A  blood transfusion. This may be needed if blood loss is severe.   Hospitalization. This may be needed if there is significant continual blood loss.   Dietary changes.  Spleen removal. HOME CARE INSTRUCTIONS Keep all follow-up appointments. It often takes many weeks to correct anemia, and having your health care provider check on your condition and your response to treatment is very important. SEEK IMMEDIATE MEDICAL CARE IF:   You develop extreme weakness, shortness of breath, or chest pain.   You become dizzy or have trouble concentrating.  You develop heavy vaginal bleeding.   You develop a rash.   You have bloody or black, tarry stools.   You faint.   You vomit up blood.   You vomit repeatedly.   You have abdominal pain.  You have a fever or persistent symptoms for more than 2-3 days.   You have a fever and your symptoms suddenly get worse.   You are dehydrated.  MAKE SURE YOU:  Understand these instructions.  Will watch your condition.  Will get help right away if you are not doing well or get worse.   This information is not intended to replace advice given to you by your health care provider. Make sure you discuss any questions you have with your health care provider.   Document Released: 02/29/2004 Document Revised: 09/23/2012 Document Reviewed: 07/17/2012 Elsevier Interactive Patient Education 2016 [place].    Blood Transfusion, Care After Refer to this sheet in the next few weeks. These instructions provide you with information about caring for yourself after your procedure. Your health care provider may also give you more  specific instructions. Your treatment has been planned according to current medical practices, but problems sometimes occur. Call your health care provider if you have any problems or questions after your procedure. WHAT TO EXPECT AFTER THE PROCEDURE After your procedure, it is common to have:  Bruising and soreness at  the IV site.  Chills or fever.  Headache. HOME CARE INSTRUCTIONS  Take medicines only as directed by your health care provider. Ask your health care provider if you can take an over-the-counter pain reliever in case you have a fever or headache a day or two after your transfusion.  Return to your normal activities as directed by your health care provider. SEEK MEDICAL CARE IF:   You develop redness or irritation at your IV site.  You have persistent fever, chills, or headache.  Your urine is darker than normal.  Your urine turns pink, red, or brown.   The white part of your eye turns yellow (jaundice).   You feel weak after doing your normal activities.  SEEK IMMEDIATE MEDICAL CARE IF:   You have trouble breathing.  You have fever and chills along with:  Anxiety.  Chest or back pain.  Flushed skin.  Clammy skin.  A rapid heartbeat.  Nausea.   This information is not intended to replace advice given to you by your health care provider. Make sure you discuss any questions you have with your health care provider.   Document Released: 02/11/2014 Document Reviewed: 02/11/2014 Elsevier Interactive Patient Education [employer].

## 2015-06-05 NOTE — ED Notes (Signed)
AAOx3.  Skin warm and dry.  Ambulates with easy and steady gait. NAD 

## 2015-06-05 NOTE — ED Provider Notes (Signed)
[facility] Emergency Department Provider Note  ____________________________________________  Time seen: Approximately 6:26 PM  I have reviewed the triage vital signs and the nursing notes.   HISTORY  Chief Complaint Shortness of Breath    HPI Jason Goodman is a 65 y.o. male with history of chronic atrial fibrillation not on anticoagulation and a recent admission for lower GI bleeding status post polyp removal during colonoscopy.  He presents today for evaluation of persistent generalized fatigue and generalized weakness.  He is also short of breath with exertion.  He reports the symptoms have been gradual in onset and consistent since his GI bleeding less than 10 days ago.He reportedly had severe bleeding and required admission and a tagged red cell study.  Least 1 bleeding site was reportedly fixed via colonoscopy.  He reports that after he left from the hospital he continued to have bleeding from another 4 days but now he is not passing any blood or clots in his stool.    Denies fever/chills, CP, abd pain, N/V/D . Shortness of breath with exertion, no wheezing.   Past Medical History  Diagnosis Date  . Hypertension   . Diabetes mellitus without complication ([place])   . prostate cancer   . Chronic atrial fibrillation ([place])   . History of lower GI bleeding     Patient Active Problem List   Diagnosis Date Noted  . GI bleed 05/27/2015    Past Surgical History  Procedure Laterality Date  . Colonoscopy      Current Outpatient Rx  Name  Route  Sig  Dispense  Refill  . amLODipine (NORVASC) 10 MG tablet   Oral   Take 10 mg by mouth daily.         Marland Kitchen atorvastatin (LIPITOR) 40 MG tablet   Oral   Take 40 mg by mouth daily.          . carvedilol (COREG) 25 MG tablet   Oral   Take 25 mg by mouth 2 (two) times daily.         . metFORMIN (GLUCOPHAGE) 500 MG tablet   Oral   Take 500 mg by mouth daily.         . naproxen sodium (ANAPROX) 220  MG tablet   Oral   Take 220 mg by mouth 2 (two) times daily as needed (for pain).           Allergies Review of patient's allergies indicates no known allergies.  Family History  Problem Relation Age of Onset  . Hypertension      Social History Social History  Substance Use Topics  . Smoking status: Current Every Day Smoker  . Smokeless tobacco: None  . Alcohol Use: No    Review of Systems Constitutional: No fever/chills.  Generalized weakness and fatigue Eyes: No visual changes. ENT: No sore throat. Cardiovascular: Denies chest pain. Respiratory: shortness of breath with exertion Gastrointestinal: No abdominal pain.  No nausea, no vomiting.  No diarrhea.  No constipation.  GI bleeding has resolved Genitourinary: Negative for dysuria.   Musculoskeletal: Negative for back pain. Skin: Negative for rash. Neurological: Negative for headaches, focal weakness or numbness.  10-point ROS otherwise negative.  ____________________________________________   PHYSICAL EXAM:  VITAL SIGNS: ED Triage Vitals  Enc Vitals Group     BP 06/05/15 1355 96/68 mmHg     Pulse Rate 06/05/15 1355 60     Resp 06/05/15 1355 24     Temp 06/05/15 1355 97.7 F (36.5  C)     Temp Source 06/05/15 1355 Oral     SpO2 06/05/15 1355 100 %     Weight 06/05/15 1355 188 lb (85.276 kg)     Height 06/05/15 1355 6' (1.829 m)     Head Cir --      Peak Flow --      Pain Score 06/05/15 1641 0     Pain Loc --      Pain Edu? --      Excl. in [place]? --     Constitutional: Alert and oriented. Well appearing and in no acute distress. Eyes: Conjunctivae are normal. PERRL. EOMI. Head: Atraumatic. Nose: No congestion/rhinnorhea. Mouth/Throat: Mucous membranes are moist.  Oropharynx non-erythematous. Neck: No stridor.  No meningeal signs.   Cardiovascular: Normal rate, regular rhythm. Good peripheral circulation. Grossly normal heart sounds.   Respiratory: Normal respiratory effort.  No retractions. Lungs  CTAB. Gastrointestinal: Soft and nontender. No distention.  Rectal:  Normal rectal exam, heme negative (quality control passed), light brown stool Musculoskeletal: No lower extremity tenderness nor edema. No gross deformities of extremities. Neurologic:  Normal speech and language. No gross focal neurologic deficits are appreciated.  Skin:  Skin is warm, dry and intact. No rash noted. Psychiatric: Mood and affect are normal. Speech and behavior are normal.  ____________________________________________   LABS (all labs ordered are listed, but only abnormal results are displayed)  Labs Reviewed  COMPREHENSIVE METABOLIC PANEL - Abnormal; Notable for the following:    Glucose, Bld 133 (*)    BUN 23 (*)    Creatinine, Ser 1.50 (*)    Calcium 8.8 (*)    Total Protein 6.1 (*)    GFR calc non Af Amer 47 (*)    GFR calc Af Amer 55 (*)    All other components within normal limits  CBC - Abnormal; Notable for the following:    RBC 2.71 (*)    Hemoglobin 8.5 (*)    HCT 26.0 (*)    RDW 15.5 (*)    All other components within normal limits  TYPE AND SCREEN  PREPARE RBC (CROSSMATCH)   ____________________________________________  EKG   ED ECG REPORT I, Rosalynn Sergent, the attending physician, personally viewed and interpreted this ECG.   Date: 06/05/2015  EKG Time: 14:01  Rate: 56  Rhythm: Atrial fibrillation with slow ventricular response  Axis: Normal  Intervals:Atrial fibrillation with QTC of 386 ms  ST&T Change: Non-specific ST segment / T-wave changes, but no evidence of acute ischemia.  ____________________________________________  RADIOLOGY   Dg Chest 2 View  06/05/2015  CLINICAL DATA:  Three-day history of shortness of breath EXAM: CHEST  2 VIEW COMPARISON:  CT abdomen and pelvis May 27, 2015 FINDINGS: Lungs are clear. Heart size and pulmonary vascularity are normal. No adenopathy. There is mild degenerative change in the thoracic spine. Aorta is mildly tortuous. The  calcification in the known left adrenal mass seen on recent CT is apparent by radiography. IMPRESSION: No edema or consolidation. Electronically Signed   By: Lowella Grip III M.D.   On: 06/05/2015 14:24    ____________________________________________   PROCEDURES  Procedure(s) performed: None  Critical Care performed: No ____________________________________________   INITIAL IMPRESSION / ASSESSMENT AND PLAN / ED COURSE  Pertinent labs & imaging results that were available during my care of the patient were reviewed by me and considered in my medical decision making (see chart for details).  The patient is symptomatic from his acute anemia secondary to GI bleeding.  Fortunately he is not continuing to bleed.  He has no signs or symptoms of any other acute or emergent medical problems.  I talked by phone with Dr. Alyssa Grove to see if it was possible to set the patient up for an outpatient blood transfusion, which has the added benefit of more closely crossmatch blood than that which will be provided by the blood bank to the emergency department.  Dr. Grayland Ormond informed me, however, that this is a particularly busy week and that there are no outpatient spots available.  As a result I will provide one unit of PRBCs to the patient and discharge home for outpatient follow-up both with his PCP as well as with Dr. Grayland Ormond.  I provided my usual and customary risks and benefits discussion to consent the patient for blood transfusion.  He and his wife understand and agree with the plan.  I also explained to the patient that he should start taking an over-the-counter iron supplement.   ____________________________________________  FINAL CLINICAL IMPRESSION(S) / ED DIAGNOSES  Final diagnoses:  Acute blood loss anemia     MEDICATIONS GIVEN AND/OR PRESCRIBED DURING THIS VISIT:  Medications  0.9 %  sodium chloride infusion (10 mL/hr Intravenous New Bag/Given 06/05/15 1943)     NEW  OUTPATIENT MEDICATIONS STARTED DURING THIS VISIT:  New Prescriptions   No medications on file      Note:  This document was prepared using Dragon voice recognition software and may include unintentional dictation errors.   Hinda Kehr, MD 06/05/15 2115

## 2015-06-06 LAB — TYPE AND SCREEN
ABO/RH(D): A POS
Antibody Screen: NEGATIVE
UNIT DIVISION: 0

## 2015-06-22 ENCOUNTER — Inpatient Hospital Stay: Payer: Medicare HMO | Attending: Oncology | Admitting: Oncology

## 2015-06-22 ENCOUNTER — Inpatient Hospital Stay: Payer: Medicare HMO

## 2015-06-22 VITALS — BP 134/88 | HR 71 | Temp 97.0°F | Resp 18 | Wt 184.5 lb

## 2015-06-22 DIAGNOSIS — I10 Essential (primary) hypertension: Secondary | ICD-10-CM | POA: Insufficient documentation

## 2015-06-22 DIAGNOSIS — Z8546 Personal history of malignant neoplasm of prostate: Secondary | ICD-10-CM | POA: Insufficient documentation

## 2015-06-22 DIAGNOSIS — K91840 Postprocedural hemorrhage and hematoma of a digestive system organ or structure following a digestive system procedure: Secondary | ICD-10-CM | POA: Diagnosis not present

## 2015-06-22 DIAGNOSIS — N2889 Other specified disorders of kidney and ureter: Secondary | ICD-10-CM | POA: Insufficient documentation

## 2015-06-22 DIAGNOSIS — I482 Chronic atrial fibrillation: Secondary | ICD-10-CM | POA: Diagnosis not present

## 2015-06-22 DIAGNOSIS — E119 Type 2 diabetes mellitus without complications: Secondary | ICD-10-CM

## 2015-06-22 DIAGNOSIS — Z79899 Other long term (current) drug therapy: Secondary | ICD-10-CM | POA: Diagnosis not present

## 2015-06-22 DIAGNOSIS — E279 Disorder of adrenal gland, unspecified: Secondary | ICD-10-CM | POA: Diagnosis not present

## 2015-06-22 DIAGNOSIS — K922 Gastrointestinal hemorrhage, unspecified: Secondary | ICD-10-CM

## 2015-06-22 DIAGNOSIS — D500 Iron deficiency anemia secondary to blood loss (chronic): Secondary | ICD-10-CM | POA: Insufficient documentation

## 2015-06-22 DIAGNOSIS — Z7984 Long term (current) use of oral hypoglycemic drugs: Secondary | ICD-10-CM | POA: Diagnosis not present

## 2015-06-22 DIAGNOSIS — F17210 Nicotine dependence, cigarettes, uncomplicated: Secondary | ICD-10-CM

## 2015-06-22 LAB — CBC WITH DIFFERENTIAL/PLATELET
BASOS ABS: 0.1 10*3/uL (ref 0–0.1)
BASOS PCT: 1 %
EOS ABS: 0.2 10*3/uL (ref 0–0.7)
Eosinophils Relative: 5 %
HEMATOCRIT: 34.9 % — AB (ref 40.0–52.0)
HEMOGLOBIN: 11.4 g/dL — AB (ref 13.0–18.0)
LYMPHS ABS: 1.5 10*3/uL (ref 1.0–3.6)
LYMPHS PCT: 29 %
MCH: 30.9 pg (ref 26.0–34.0)
MCHC: 32.6 g/dL (ref 32.0–36.0)
MCV: 94.9 fL (ref 80.0–100.0)
MONOS PCT: 9 %
Monocytes Absolute: 0.5 10*3/uL (ref 0.2–1.0)
NEUTROS PCT: 56 %
Neutro Abs: 2.9 10*3/uL (ref 1.4–6.5)
Platelets: 191 10*3/uL (ref 150–440)
RBC: 3.68 MIL/uL — AB (ref 4.40–5.90)
RDW: 15.9 % — ABNORMAL HIGH (ref 11.5–14.5)
WBC: 5.2 10*3/uL (ref 3.8–10.6)

## 2015-06-22 LAB — IRON AND TIBC
IRON: 34 ug/dL — AB (ref 45–182)
Saturation Ratios: 10 % — ABNORMAL LOW (ref 17.9–39.5)
TIBC: 338 ug/dL (ref 250–450)
UIBC: 304 ug/dL

## 2015-06-22 LAB — APTT: aPTT: 34 seconds (ref 24–36)

## 2015-06-22 LAB — FERRITIN: Ferritin: 28 ng/mL (ref 24–336)

## 2015-06-22 NOTE — Progress Notes (Signed)
Patient had a ER visit for bleeding complications from removal of polyps during colonoscopy and he received 1 unit of blood.  He has not noticed anymore bleeding.

## 2015-06-23 LAB — COAG STUDIES INTERP REPORT: PDF Image: 0

## 2015-06-23 LAB — VON WILLEBRAND PANEL
Coagulation Factor VIII: 84 % (ref 57–163)
Ristocetin Co-factor, Plasma: 62 % (ref 50–200)
VON WILLEBRAND ANTIGEN, PLASMA: 70 % (ref 50–200)

## 2015-06-26 NOTE — Progress Notes (Signed)
[facility]  [phone] [phone]  ID: Barbette Or OB: [birth year]  MR#: [id number]  [id number]  Patient Care Team: Herminio Commons, MD as PCP - General (Family Medicine)  CHIEF COMPLAINT:  Chief Complaint  Patient presents with  . New Evaluation  . Anemia    INTERVAL HISTORY: Patient is a 65 year old male who was recently evaluated in the emergency room after excessive bleeding from polypectomy on routine screening colonoscopy. Patient's hemoglobin dropped nearly 4 points and he required 1 unit of packed red blood cells. Currently, patient feels well and back to his baseline. He has not noted any additional rectal bleeding. He has no neurologic complaints. He denies any recent fevers. He denies any chest pain or shortness of breath. He has a good appetite and denies weight loss. He has no nausea, vomiting, constipation, or diarrhea. He has no urinary complaints. Patient otherwise feels well and offers no further specific complaints today.  REVIEW OF SYSTEMS:   Review of Systems  Constitutional: Negative.  Negative for fever, weight loss and malaise/fatigue.  Respiratory: Negative.  Negative for cough and shortness of breath.   Cardiovascular: Negative.  Negative for chest pain.  Gastrointestinal: Negative.  Negative for nausea, vomiting, abdominal pain, diarrhea, constipation, blood in stool and melena.  Genitourinary: Negative.   Musculoskeletal: Negative.   Neurological: Negative.  Negative for weakness.  Endo/Heme/Allergies: Does not bruise/bleed easily.  Psychiatric/Behavioral: Negative.     As per HPI. Otherwise, a complete review of systems is negatve.  PAST MEDICAL HISTORY: Past Medical History  Diagnosis Date  . Hypertension   . Diabetes mellitus without complication ([place])   . prostate cancer   . Chronic atrial fibrillation ([place])   . History of lower GI bleeding     PAST SURGICAL HISTORY: Past Surgical History    Procedure Laterality Date  . Colonoscopy      FAMILY HISTORY Family History  Problem Relation Age of Onset  . Hypertension         ADVANCED DIRECTIVES:    HEALTH MAINTENANCE: Social History  Substance Use Topics  . Smoking status: Current Every Day Smoker  . Smokeless tobacco: Not on file  . Alcohol Use: No     Colonoscopy:  PAP:  Bone density:  Lipid panel:  No Known Allergies  Current Outpatient Prescriptions  Medication Sig Dispense Refill  . amLODipine (NORVASC) 10 MG tablet Take 10 mg by mouth daily.    Marland Kitchen atorvastatin (LIPITOR) 40 MG tablet Take 40 mg by mouth daily.     . carvedilol (COREG) 25 MG tablet Take 25 mg by mouth 2 (two) times daily.    . ferrous sulfate 325 (65 FE) MG tablet Take 325 mg by mouth 3 (three) times daily with meals.    . metFORMIN (GLUCOPHAGE) 500 MG tablet Take 500 mg by mouth daily.    . naproxen sodium (ANAPROX) 220 MG tablet Take 220 mg by mouth 2 (two) times daily as needed (for pain).     No current facility-administered medications for this visit.    OBJECTIVE: Filed Vitals:   06/22/15 1449  BP: 134/88  Pulse: 71  Temp: 97 F (36.1 C)  Resp: 18     Body mass index is 25.02 kg/(m^2).    ECOG FS:0 - Asymptomatic  General: Well-developed, well-nourished, no acute distress. Eyes: Pink conjunctiva, anicteric sclera. HEENT: Normocephalic, moist mucous membranes, clear oropharnyx. Lungs: Clear to auscultation bilaterally. Heart: Regular rate and rhythm. No rubs, murmurs, or gallops. Abdomen: Soft,  nontender, nondistended. No organomegaly noted, normoactive bowel sounds. Musculoskeletal: No edema, cyanosis, or clubbing. Neuro: Alert, answering all questions appropriately. Cranial nerves grossly intact. Skin: No rashes or petechiae noted. Psych: Normal affect. Lymphatics: No cervical, calvicular, axillary or inguinal LAD.   LAB RESULTS:  Lab Results  Component Value Date   NA 139 06/05/2015   K 4.0 06/05/2015   CL 110  06/05/2015   CO2 23 06/05/2015   GLUCOSE 133* 06/05/2015   BUN 23* 06/05/2015   CREATININE 1.50* 06/05/2015   CALCIUM 8.8* 06/05/2015   PROT 6.1* 06/05/2015   ALBUMIN 3.7 06/05/2015   AST 25 06/05/2015   ALT 26 06/05/2015   ALKPHOS 68 06/05/2015   BILITOT 0.4 06/05/2015   GFRNONAA 47* 06/05/2015   GFRAA 55* 06/05/2015    Lab Results  Component Value Date   WBC 5.2 06/22/2015   NEUTROABS 2.9 06/22/2015   HGB 11.4* 06/22/2015   HCT 34.9* 06/22/2015   MCV 94.9 06/22/2015   PLT 191 06/22/2015   Lab Results  Component Value Date   IRON 34* 06/22/2015   TIBC 338 06/22/2015   IRONPCTSAT 10* 06/22/2015    Lab Results  Component Value Date   FERRITIN 28 06/22/2015     STUDIES: Dg Chest 2 View  06/05/2015  CLINICAL DATA:  Three-day history of shortness of breath EXAM: CHEST  2 VIEW COMPARISON:  CT abdomen and pelvis May 27, 2015 FINDINGS: Lungs are clear. Heart size and pulmonary vascularity are normal. No adenopathy. There is mild degenerative change in the thoracic spine. Aorta is mildly tortuous. The calcification in the known left adrenal mass seen on recent CT is apparent by radiography. IMPRESSION: No edema or consolidation. Electronically Signed   By: Lowella Grip III M.D.   On: 06/05/2015 14:24   Nm Gi Blood Loss  05/27/2015  CLINICAL DATA:  A suggest some bloating 2 days status post colonoscopy. Activity bleeding ovary time mucosa bathroom. EXAM: NUCLEAR MEDICINE GASTROINTESTINAL BLEEDING SCAN TECHNIQUE: Sequential abdominal images were obtained following intravenous administration of Tc-64m labeled red blood cells. RADIOPHARMACEUTICALS:  23.4 mCi Tc-21m in-vitro labeled red cells. COMPARISON:  CT 05/27/2015 FINDINGS: There is no accumulation of tagged red blood cells within the abdomen or pelvis to localize active gastrointestinal bleeding. There is activity inferior to the bladder which is felt to represent urine contamination. This is relatively static throughout  the exam. Physiologic activity is noted in the blood pool and bladder. IMPRESSION: No scintigraphic evidence of active gastrointestinal bleeding. Electronically Signed   By: Suzy Bouchard M.D.   On: 05/27/2015 19:33    ASSESSMENT: Excessive bleeding after routine polypectomy  PLAN:    1. Excessive bleeding: Patient's coagulation studies as well as von Willebrand's panel are within normal limits. He has no evidence of an underlying bleeding disorder, despite his extensive bleed after polypectomy. No further intervention is needed at this time. No follow-up is necessary. 2. Iron deficiency anemia: Although patient remains iron deficient from his recent bleed, his hemoglobin continues to improve. Continue oral iron supplementation. Patient does not require IV iron. 3. Renal insufficiency: Mild, continue monitoring treatment per primary care.  Patient expressed understanding and was in agreement with this plan. He also understands that He can call clinic at any time with any questions, concerns, or complaints.    Lloyd Huger, MD   06/26/2015 6:41 PM

## 2015-06-27 ENCOUNTER — Telehealth: Payer: Self-pay | Admitting: *Deleted

## 2015-06-27 NOTE — Telephone Encounter (Signed)
Called patient to inform him lab results are normal, no follow up needed with Dr. Grayland Ormond. Patients family notified and verbalized understanding.

## 2015-10-21 ENCOUNTER — Emergency Department
Admission: EM | Admit: 2015-10-21 | Discharge: 2015-10-21 | Disposition: A | Discharge status: Home / Self Care | Payer: Medicare HMO | Attending: Emergency Medicine | Admitting: Emergency Medicine

## 2015-10-21 ENCOUNTER — Encounter: Payer: Self-pay | Admitting: Emergency Medicine

## 2015-10-21 DIAGNOSIS — E119 Type 2 diabetes mellitus without complications: Secondary | ICD-10-CM | POA: Insufficient documentation

## 2015-10-21 DIAGNOSIS — Z791 Long term (current) use of non-steroidal anti-inflammatories (NSAID): Secondary | ICD-10-CM | POA: Insufficient documentation

## 2015-10-21 DIAGNOSIS — Z7984 Long term (current) use of oral hypoglycemic drugs: Secondary | ICD-10-CM | POA: Insufficient documentation

## 2015-10-21 DIAGNOSIS — Z8546 Personal history of malignant neoplasm of prostate: Secondary | ICD-10-CM | POA: Insufficient documentation

## 2015-10-21 DIAGNOSIS — I10 Essential (primary) hypertension: Secondary | ICD-10-CM | POA: Insufficient documentation

## 2015-10-21 DIAGNOSIS — F17200 Nicotine dependence, unspecified, uncomplicated: Secondary | ICD-10-CM | POA: Diagnosis not present

## 2015-10-21 DIAGNOSIS — J40 Bronchitis, not specified as acute or chronic: Secondary | ICD-10-CM | POA: Diagnosis not present

## 2015-10-21 DIAGNOSIS — R05 Cough: Secondary | ICD-10-CM | POA: Diagnosis present

## 2015-10-21 DIAGNOSIS — Z79899 Other long term (current) drug therapy: Secondary | ICD-10-CM | POA: Insufficient documentation

## 2015-10-21 MED ORDER — AMOXICILLIN 500 MG PO TABS: 500.0000 mg | ORAL_TABLET | Freq: Three times a day (TID) | ORAL | 0 refills | Status: DC

## 2015-10-21 MED ORDER — PREDNISONE 10 MG (21) PO TBPK: ORAL_TABLET | ORAL | 0 refills | Status: DC

## 2015-10-21 MED ORDER — PREDNISONE 20 MG PO TABS
60.0000 mg | ORAL_TABLET | Freq: Once | ORAL | Status: AC
  Administered 2015-10-21: 60 mg via ORAL
  Filled 2015-10-21: qty 3

## 2015-10-21 MED ORDER — AMOXICILLIN 500 MG PO CAPS
500.0000 mg | ORAL_CAPSULE | Freq: Once | ORAL | Status: AC
  Administered 2015-10-21: 500 mg via ORAL
  Filled 2015-10-21: qty 1

## 2015-10-21 MED ORDER — HYDROCODONE-HOMATROPINE 5-1.5 MG/5ML PO SYRP: 5.0000 mL | ORAL_SOLUTION | Freq: Four times a day (QID) | ORAL | 0 refills | Status: DC | PRN

## 2015-10-21 NOTE — ED Provider Notes (Signed)
[facility] Provider Note  ____________________________________________  Time seen: Approximately 9:02 PM  I have reviewed the triage vital signs and the nursing notes.   HISTORY  Chief Complaint Cough and Nasal Congestion (Chest Congestion)    HPI Jason Goodman is a 65 y.o. male smoker with 1 week of worsening cough, congestion, drainage. Also now some wheezing with shortness of breath. History of diabetes. Is on metformin. Followed by Dr. Gwynneth Aliment.Reports feeling hot at times with chills and has had some sweats.    Past Medical History:  Diagnosis Date  . Chronic atrial fibrillation ([place])   . Diabetes mellitus without complication ([place])   . History of lower GI bleeding   . Hypertension   . prostate cancer     Patient Active Problem List   Diagnosis Date Noted  . GI bleed 05/27/2015    Past Surgical History:  Procedure Laterality Date  . COLONOSCOPY    . PROSTATECTOMY  2012    Current Outpatient Rx  . Order #: [id number] Class: Historical Med  . Order #: [id number] Class: Print  . Order #: [id number] Class: Historical Med  . Order #: [id number] Class: Historical Med  . Order #: [id number] Class: Historical Med  . Order #: [id number] Class: Print  . Order #: [id number] Class: Historical Med  . Order #: [id number] Class: Historical Med  . Order #: [id number] Class: Print    Allergies Review of patient's allergies indicates no known allergies.  Family History  Problem Relation Age of Onset  . Hypertension      Social History Social History  Substance Use Topics  . Smoking status: Current Every Day Smoker    Packs/day: 0.50  . Smokeless tobacco: Never Used  . Alcohol use No    Review of Systems Constitutional:  Fever/chills/sweats Eyes: No visual changes. ENT: per HPI Cardiovascular: Denies chest pain. Respiratory: MILD wheezing,  shortness of breath. Gastrointestinal: No abdominal pain.  No nausea, no vomiting.   Genitourinary: Negative for dysuria. Musculoskeletal: Negative for back pain. Skin: Negative for rash. Neurological: Negative for  focal weakness or numbness. 10-point ROS otherwise negative.  ____________________________________________   PHYSICAL EXAM:  VITAL SIGNS: ED Triage Vitals  Enc Vitals Group     BP 10/21/15 1958 (!) 130/91     Pulse Rate 10/21/15 1958 69     Resp 10/21/15 1958 18     Temp 10/21/15 1958 97.9 F (36.6 C)     Temp Source 10/21/15 1958 Oral     SpO2 10/21/15 1958 97 %     Weight 10/21/15 1959 180 lb (81.6 kg)     Height 10/21/15 1959 6' (1.829 m)     Head Circumference --      Peak Flow --      Pain Score --      Pain Loc --      Pain Edu? --      Excl. in [place]? --     Constitutional: Alert and oriented. Well appearing and in no acute distress. Eyes: Conjunctivae are normal. PERRL. EOMI. Ears:  Clear with normal landmarks. No erythema. Head: Atraumatic. Nose: No congestion/rhinnorhea. Mouth/Throat: Mucous membranes are moist.  Oropharynx erythematous. No lesions. Neck:  Supple.  No adenopathy.   Cardiovascular: Normal rate, regular rhythm. Grossly normal heart sounds.   Respiratory: Normal respiratory effort.  No retractions.Diminished with faint wheezing bilaterally, more anteriorly. Gastrointestinal: Soft and nontender. No distention. No abdominal bruits.  Musculoskeletal: Nml ROM of upper and lower extremity joints. Neurologic:  Normal speech and language. No gross  focal neurologic deficits are appreciated. No gait instability. Skin:  Skin is warm, dry and intact. No rash noted. Psychiatric: Mood and affect are normal. Speech and behavior are normal.  ____________________________________________   LABS (all labs ordered are listed, but only abnormal results are displayed)  Labs Reviewed - No data to  display ____________________________________________  EKG   ____________________________________________  RADIOLOGY   ____________________________________________   PROCEDURES  Procedure(s) performed: None  Critical Care performed: No  ____________________________________________   INITIAL IMPRESSION / ASSESSMENT AND PLAN / ED COURSE  Pertinent labs & imaging results that were available during my care of the patient were reviewed by me and considered in my medical decision making (see chart for details).  65 year old male, smoker with history of diabetes, A. fib, hypertension who presents with worsening cough and congestion and now wheezing with shortness of breath. Stable O2 sats of 97%. Lungs with diminished breath sounds and faint wheezing. Given prednisone and amoxicillin, along with Hycodan cough syrup. Encouraged follow-up with primary physician or return to the emergency room for any worsening symptoms. ____________________________________________   FINAL CLINICAL IMPRESSION(S) / ED DIAGNOSES  Final diagnoses:  Bronchitis      Mortimer Fries, PA-C 10/21/15 2106    Rudene Re, MD 10/22/15 [phone]

## 2015-10-21 NOTE — ED Triage Notes (Signed)
Pt states he has had a productive cough with green phlegm, chest congestion, and chills for a week. Pt denies any respiratory illness or hx.

## 2015-10-21 NOTE — Discharge Instructions (Signed)
Take antibiotics, prednisone and cough medicine as directed. Follow-up with your physician if not improving. Monitor your sugar, and if elevated should cut back on the prednisone. Return to the emergency room for any worsening symptoms.

## 2016-01-07 ENCOUNTER — Encounter: Payer: Self-pay | Admitting: Emergency Medicine

## 2016-01-07 ENCOUNTER — Emergency Department
Admission: EM | Admit: 2016-01-07 | Discharge: 2016-01-07 | Disposition: A | Discharge status: Home / Self Care | Payer: Medicare HMO | Attending: Emergency Medicine | Admitting: Emergency Medicine

## 2016-01-07 ENCOUNTER — Emergency Department: Payer: Medicare HMO

## 2016-01-07 DIAGNOSIS — S3992XA Unspecified injury of lower back, initial encounter: Secondary | ICD-10-CM | POA: Diagnosis present

## 2016-01-07 DIAGNOSIS — Z79899 Other long term (current) drug therapy: Secondary | ICD-10-CM | POA: Insufficient documentation

## 2016-01-07 DIAGNOSIS — Y999 Unspecified external cause status: Secondary | ICD-10-CM | POA: Diagnosis not present

## 2016-01-07 DIAGNOSIS — Y9389 Activity, other specified: Secondary | ICD-10-CM | POA: Insufficient documentation

## 2016-01-07 DIAGNOSIS — S39012A Strain of muscle, fascia and tendon of lower back, initial encounter: Secondary | ICD-10-CM | POA: Diagnosis not present

## 2016-01-07 DIAGNOSIS — F17200 Nicotine dependence, unspecified, uncomplicated: Secondary | ICD-10-CM | POA: Insufficient documentation

## 2016-01-07 DIAGNOSIS — Y92410 Unspecified street and highway as the place of occurrence of the external cause: Secondary | ICD-10-CM | POA: Insufficient documentation

## 2016-01-07 DIAGNOSIS — I10 Essential (primary) hypertension: Secondary | ICD-10-CM | POA: Insufficient documentation

## 2016-01-07 DIAGNOSIS — Z8546 Personal history of malignant neoplasm of prostate: Secondary | ICD-10-CM | POA: Insufficient documentation

## 2016-01-07 DIAGNOSIS — E119 Type 2 diabetes mellitus without complications: Secondary | ICD-10-CM | POA: Diagnosis not present

## 2016-01-07 DIAGNOSIS — Z7984 Long term (current) use of oral hypoglycemic drugs: Secondary | ICD-10-CM | POA: Insufficient documentation

## 2016-01-07 MED ORDER — MELOXICAM 7.5 MG PO TABS: 7.5000 mg | ORAL_TABLET | Freq: Every day | ORAL | 0 refills | Status: AC
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
## 2017-10-03 IMAGING — CR DG CHEST 2V
2 series · 2 of 2 positions shown · non-contrast
Comparison: CT abdomen and pelvis May 27, 2015

CLINICAL DATA: Three-day history of shortness of breath

EXAM:
CHEST  2 VIEW

[chest pa]
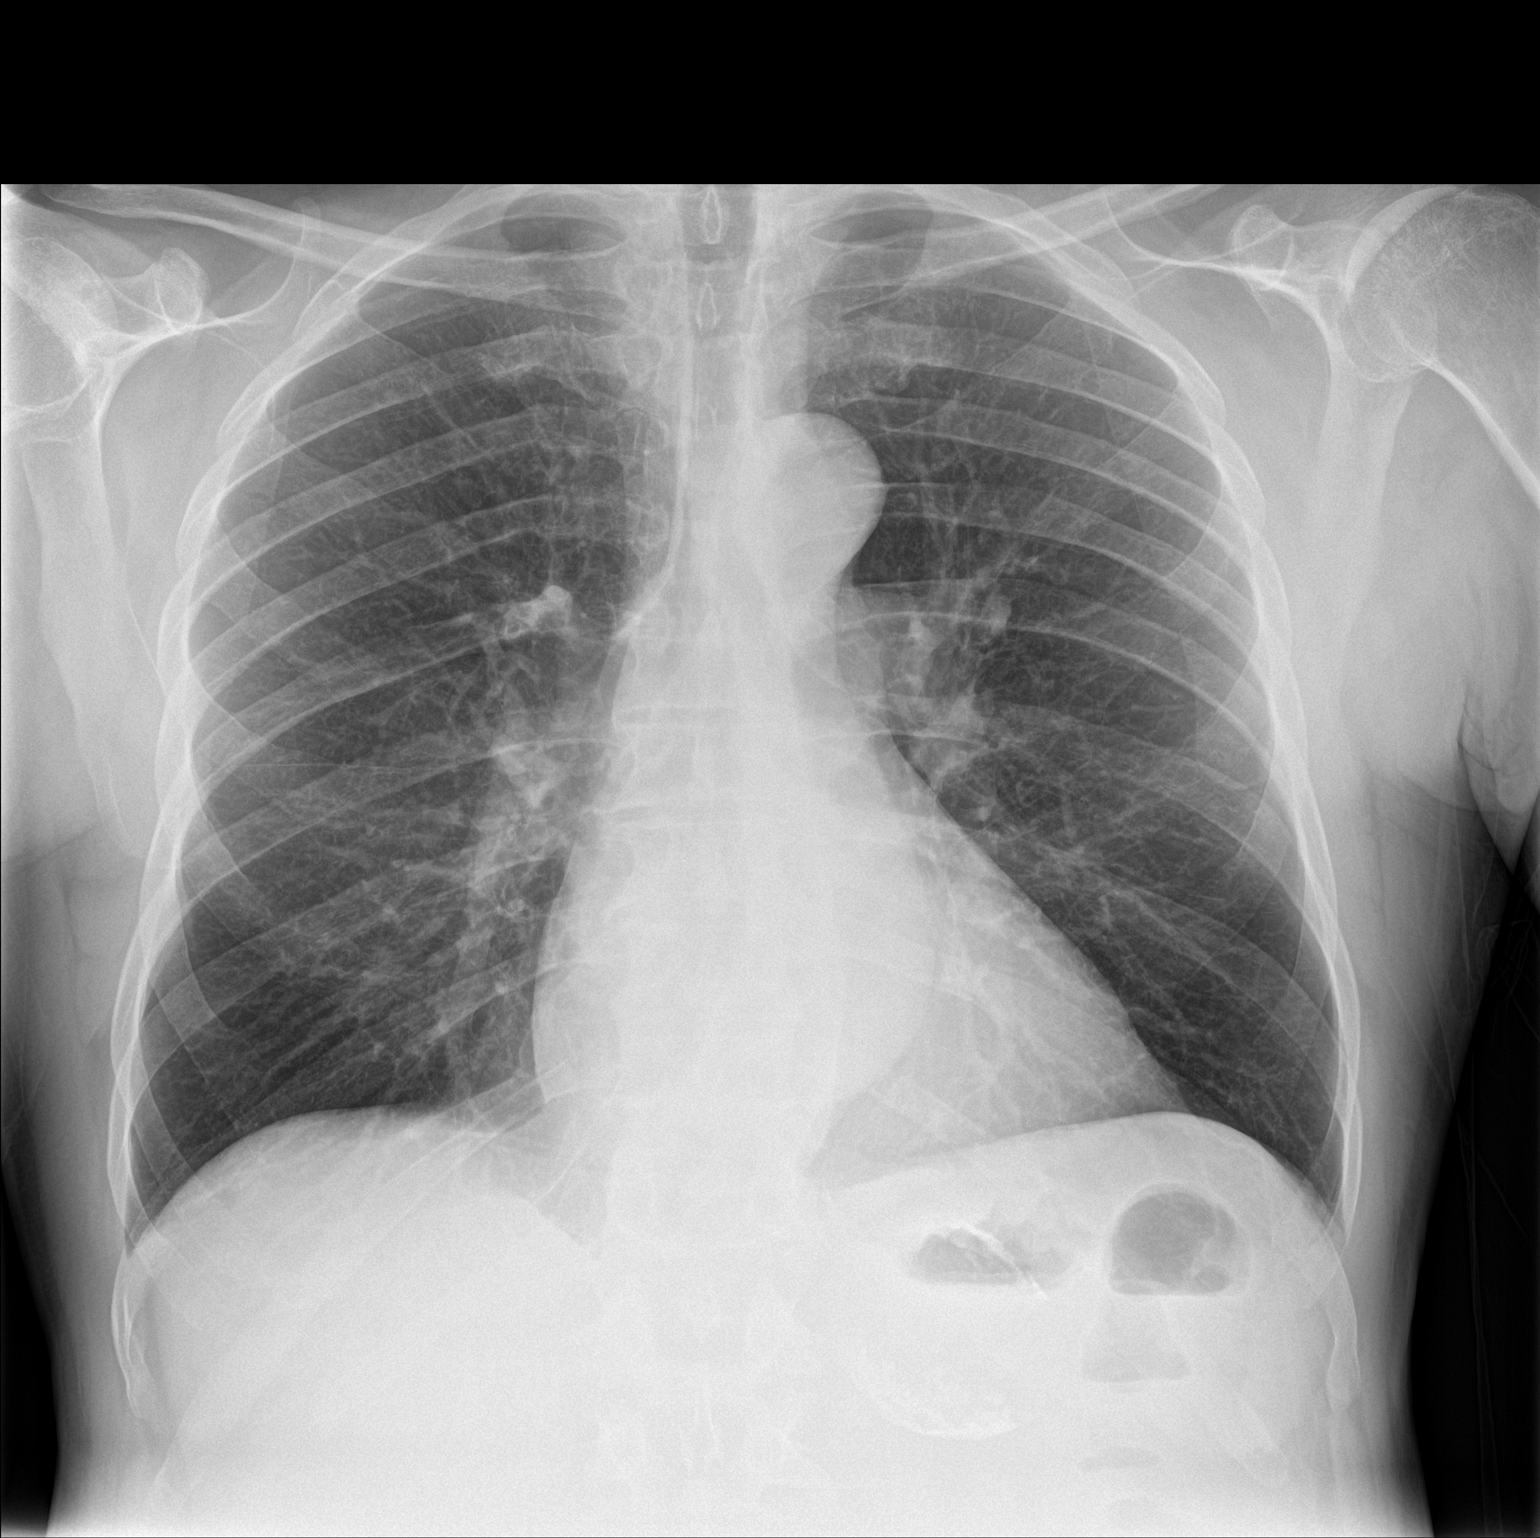

[chest lat]
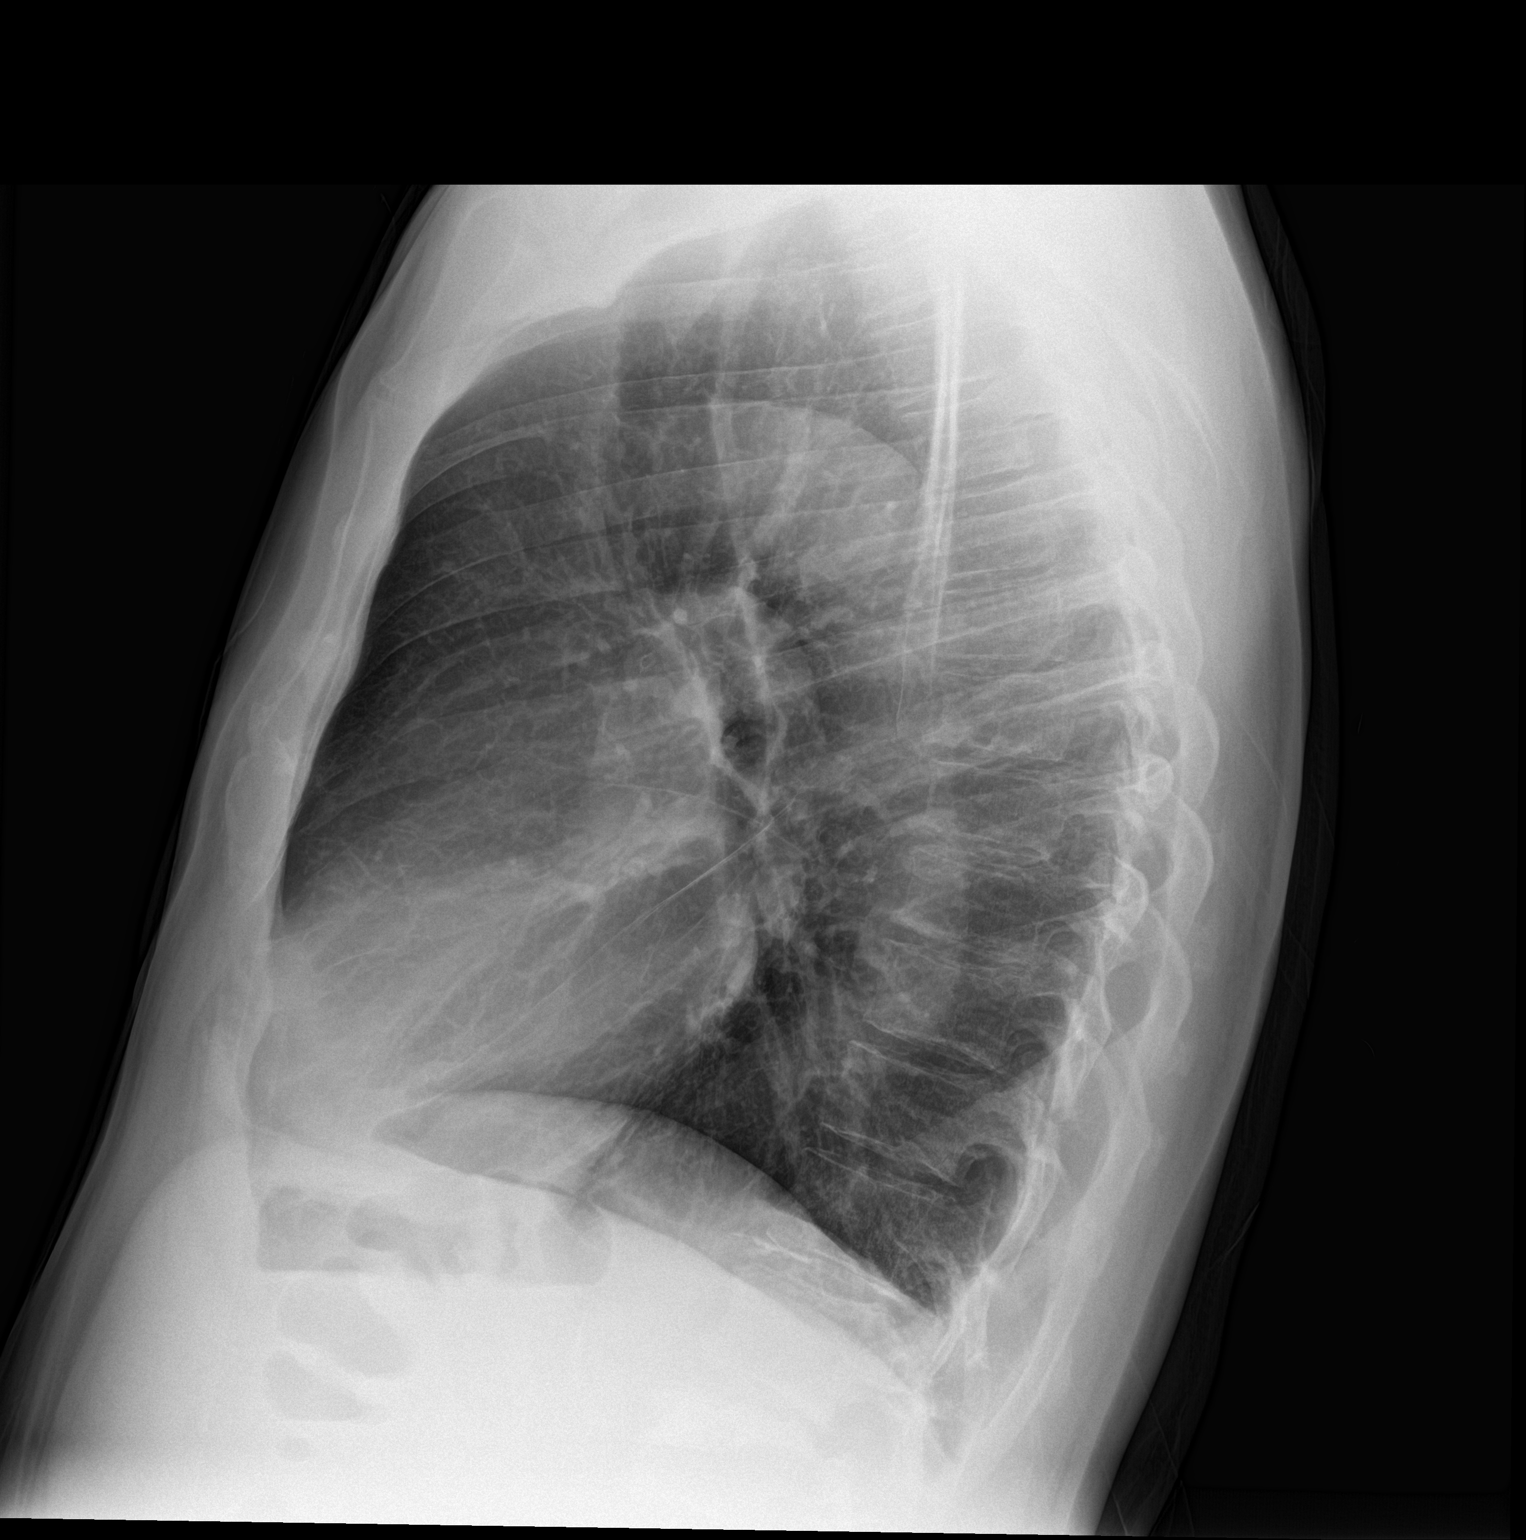

[2 of 2 positions shown; findings below may reference images not displayed]

FINDINGS: Lungs are clear. Heart size and pulmonary vascularity are normal. No
adenopathy. There is mild degenerative change in the thoracic spine.
Aorta is mildly tortuous. The calcification in the known left
adrenal mass seen on recent CT is apparent by radiography.
IMPRESSION: No edema or consolidation.

## 2018-11-05 DEATH — deceased
